# Patient Record
Sex: Female | Born: 1950 | Marital: Married | Smoking: Never smoker
Health system: Southern US, Community
[De-identification: ages and names within clinical notes are randomized; demographics above are authoritative.]

## PROBLEM LIST (undated history)

## (undated) DIAGNOSIS — L438 Other lichen planus: Secondary | ICD-10-CM

## (undated) DIAGNOSIS — M810 Age-related osteoporosis without current pathological fracture: Secondary | ICD-10-CM

## (undated) HISTORY — DX: Other lichen planus: L43.8

## (undated) HISTORY — PX: TUBAL LIGATION: SHX77

---

## 1999-02-24 ENCOUNTER — Ambulatory Visit (HOSPITAL_COMMUNITY): Admission: RE | Admit: 1999-02-24 | Discharge: 1999-02-24 | Payer: Self-pay

## 1999-11-01 ENCOUNTER — Encounter: Admission: RE | Admit: 1999-11-01 | Discharge: 1999-11-01 | Payer: Self-pay | Admitting: *Deleted

## 1999-11-01 ENCOUNTER — Encounter: Payer: Self-pay | Admitting: Allergy and Immunology

## 2005-12-17 ENCOUNTER — Encounter: Admission: RE | Admit: 2005-12-17 | Discharge: 2005-12-17 | Payer: Self-pay | Admitting: Cardiovascular Disease

## 2008-01-06 ENCOUNTER — Ambulatory Visit: Payer: Self-pay | Admitting: Internal Medicine

## 2009-02-15 ENCOUNTER — Ambulatory Visit: Payer: Self-pay | Admitting: Internal Medicine

## 2010-09-20 ENCOUNTER — Ambulatory Visit: Payer: Self-pay | Admitting: Internal Medicine

## 2012-01-28 ENCOUNTER — Encounter: Payer: Self-pay | Admitting: Orthopedic Surgery

## 2012-01-31 ENCOUNTER — Encounter: Payer: Self-pay | Admitting: Orthopedic Surgery

## 2012-03-02 ENCOUNTER — Encounter: Payer: Self-pay | Admitting: Orthopedic Surgery

## 2013-12-08 ENCOUNTER — Ambulatory Visit: Payer: Self-pay | Admitting: Internal Medicine

## 2015-12-26 ENCOUNTER — Other Ambulatory Visit: Payer: Self-pay | Admitting: Internal Medicine

## 2015-12-26 DIAGNOSIS — Z1231 Encounter for screening mammogram for malignant neoplasm of breast: Secondary | ICD-10-CM

## 2015-12-30 ENCOUNTER — Ambulatory Visit
Admission: RE | Admit: 2015-12-30 | Discharge: 2015-12-30 | Disposition: A | Discharge status: Home / Self Care | Payer: BLUE CROSS/BLUE SHIELD | Source: Ambulatory Visit | Attending: Internal Medicine | Admitting: Internal Medicine

## 2015-12-30 ENCOUNTER — Other Ambulatory Visit: Payer: Self-pay | Admitting: Internal Medicine

## 2015-12-30 DIAGNOSIS — Z1231 Encounter for screening mammogram for malignant neoplasm of breast: Secondary | ICD-10-CM

## 2016-06-12 ENCOUNTER — Observation Stay
Admission: EM | Admit: 2016-06-12 | Discharge: 2016-06-13 | Disposition: A | Discharge status: Home / Self Care | Payer: BLUE CROSS/BLUE SHIELD | Attending: Internal Medicine | Admitting: Internal Medicine

## 2016-06-12 ENCOUNTER — Emergency Department: Payer: BLUE CROSS/BLUE SHIELD

## 2016-06-12 ENCOUNTER — Encounter: Payer: Self-pay | Admitting: Emergency Medicine

## 2016-06-12 DIAGNOSIS — R4781 Slurred speech: Secondary | ICD-10-CM | POA: Diagnosis not present

## 2016-06-12 DIAGNOSIS — G459 Transient cerebral ischemic attack, unspecified: Principal | ICD-10-CM | POA: Diagnosis present

## 2016-06-12 DIAGNOSIS — R112 Nausea with vomiting, unspecified: Secondary | ICD-10-CM | POA: Diagnosis not present

## 2016-06-12 LAB — URINALYSIS, COMPLETE (UACMP) WITH MICROSCOPIC
BACTERIA UA: NONE SEEN
Bilirubin Urine: NEGATIVE
GLUCOSE, UA: NEGATIVE mg/dL
Hgb urine dipstick: NEGATIVE
Ketones, ur: NEGATIVE mg/dL
Leukocytes, UA: NEGATIVE
NITRITE: NEGATIVE
Protein, ur: NEGATIVE mg/dL
SPECIFIC GRAVITY, URINE: 1.004 — AB (ref 1.005–1.030)
WBC, UA: NONE SEEN WBC/hpf (ref 0–5)
pH: 6 (ref 5.0–8.0)

## 2016-06-12 LAB — CBC WITH DIFFERENTIAL/PLATELET
BASOS ABS: 0 10*3/uL (ref 0–0.1)
BASOS PCT: 1 %
EOS PCT: 2 %
Eosinophils Absolute: 0.2 10*3/uL (ref 0–0.7)
HEMATOCRIT: 39.3 % (ref 35.0–47.0)
Hemoglobin: 13.1 g/dL (ref 12.0–16.0)
Lymphocytes Relative: 27 %
Lymphs Abs: 1.9 10*3/uL (ref 1.0–3.6)
MCH: 30.9 pg (ref 26.0–34.0)
MCHC: 33.5 g/dL (ref 32.0–36.0)
MCV: 92.2 fL (ref 80.0–100.0)
MONO ABS: 0.5 10*3/uL (ref 0.2–0.9)
Monocytes Relative: 7 %
NEUTROS ABS: 4.4 10*3/uL (ref 1.4–6.5)
Neutrophils Relative %: 63 %
PLATELETS: 290 10*3/uL (ref 150–440)
RBC: 4.26 MIL/uL (ref 3.80–5.20)
RDW: 12.3 % (ref 11.5–14.5)
WBC: 7 10*3/uL (ref 3.6–11.0)

## 2016-06-12 LAB — BASIC METABOLIC PANEL
ANION GAP: 6 (ref 5–15)
BUN: 15 mg/dL (ref 6–20)
CALCIUM: 9.4 mg/dL (ref 8.9–10.3)
CO2: 25 mmol/L (ref 22–32)
Chloride: 107 mmol/L (ref 101–111)
Creatinine, Ser: 0.92 mg/dL (ref 0.44–1.00)
GFR calc non Af Amer: >60 mL/min (ref >60)
Glucose, Bld: 111 mg/dL — ABNORMAL HIGH (ref 65–99)
POTASSIUM: 4.4 mmol/L (ref 3.5–5.1)
Sodium: 138 mmol/L (ref 135–145)

## 2016-06-12 LAB — TROPONIN I: Troponin I: <0.03 ng/mL (ref <0.03)

## 2016-06-12 MED ORDER — STROKE: EARLY STAGES OF RECOVERY BOOK
Freq: Once | Status: AC
  Administered 2016-06-12: 22:00:00

## 2016-06-12 MED ORDER — ENOXAPARIN SODIUM 40 MG/0.4ML ~~LOC~~ SOLN
40.0000 mg | SUBCUTANEOUS | Status: DC
  Administered 2016-06-12: 23:00:00 40 mg via SUBCUTANEOUS
  Filled 2016-06-12: qty 0.4

## 2016-06-12 MED ORDER — ASPIRIN EC 325 MG PO TBEC
325.0000 mg | DELAYED_RELEASE_TABLET | Freq: Every day | ORAL | Status: DC
  Administered 2016-06-13: 11:00:00 325 mg via ORAL
  Filled 2016-06-12: qty 1

## 2016-06-12 MED ORDER — ACETAMINOPHEN 325 MG PO TABS: 650.0000 mg | ORAL_TABLET | ORAL | Status: DC | PRN

## 2016-06-12 MED ORDER — SODIUM CHLORIDE 0.9 % IV SOLN
INTRAVENOUS | Status: AC
  Administered 2016-06-12: 22:00:00 via INTRAVENOUS

## 2016-06-12 MED ORDER — ACETAMINOPHEN 650 MG RE SUPP: 650.0000 mg | RECTAL | Status: DC | PRN

## 2016-06-12 MED ORDER — ACETAMINOPHEN 160 MG/5ML PO SOLN: 650.0000 mg | ORAL | Status: DC | PRN

## 2016-06-12 MED ORDER — ASPIRIN 81 MG PO CHEW
324.0000 mg | CHEWABLE_TABLET | Freq: Once | ORAL | Status: AC
  Administered 2016-06-12: 324 mg via ORAL
  Filled 2016-06-12: qty 4

## 2016-06-12 MED ORDER — INFLUENZA VAC SPLIT QUAD 0.5 ML IM SUSY
0.5000 mL | PREFILLED_SYRINGE | INTRAMUSCULAR | Status: AC
  Administered 2016-06-13: 11:00:00 0.5 mL via INTRAMUSCULAR
  Filled 2016-06-12: qty 0.5

## 2016-06-12 MED ORDER — SENNOSIDES-DOCUSATE SODIUM 8.6-50 MG PO TABS: 1.0000 | ORAL_TABLET | Freq: Every evening | ORAL | Status: DC | PRN

## 2016-06-12 NOTE — ED Triage Notes (Signed)
Patient comes in via [facility] for dizziness, slurred speech, vomiting and generalized weakness. Patient was at doctors office with husband went to bathroom had a bowel movement afterward got dizzy, vomitted and had some slurred speech.

## 2016-06-12 NOTE — H&P (Signed)
[facility] [place] [facility]   PATIENT NAME: Stacy Murphy    MR#:  [id number]  DATE OF BIRTH:  11-14-1950  DATE OF ADMISSION:  06/12/2016  PRIMARY CARE PHYSICIAN: Masood,MD REQUESTING/REFERRING PHYSICIAN: Nita Sicklearolina Veronese, MD  CHIEF COMPLAINT:   dizzy and slurry speech  HISTORY OF PRESENT ILLNESS:  Stacy Murphy  is a 65 y.o. female with No significant past medical history when to Dr. Cleatis PolkaMasood office with her husband to accompany him for follow-up appointment. Patient became dizzy and felt like room is spinning around her. Nauseous and vomited once. then she started having slurry speech. EMS was called and patient's EKG and blood glucose was normal. Patient felt better after she vomited once and had a bowel movement. Denies any similar complaints in the past. Denies any double vision or blurry vision. Denies any headache. CT head is negative in the emergency department  PAST MEDICAL HISTORY:  History reviewed. No pertinent past medical history.  PAST SURGICAL HISTOIRY:  History reviewed. No pertinent surgical history.  SOCIAL HISTORY:   Social History  Substance Use Topics  . Smoking status: Never Smoker  . Smokeless tobacco: Never Used  . Alcohol use No    FAMILY HISTORY:   Family History  Problem Relation Age of Onset  . Breast cancer Neg Hx     DRUG ALLERGIES:  No Known Allergies  REVIEW OF SYSTEMS:  CONSTITUTIONAL: No fever, fatigue or weakness.  EYES: No blurred or double vision.  EARS, NOSE, AND THROAT: No tinnitus or ear pain.  RESPIRATORY: No cough, shortness of breath, wheezing or hemoptysis.  CARDIOVASCULAR: No chest pain, orthopnea, edema.  GASTROINTESTINAL: No nausea, vomiting, diarrhea or abdominal pain.  GENITOURINARY: No dysuria, hematuria.  ENDOCRINE: No polyuria, nocturia,  HEMATOLOGY: No anemia, easy bruising or bleeding SKIN: No rash or lesion. MUSCULOSKELETAL: No joint pain or arthritis.   NEUROLOGIC: No  tingling, numbness, weakness.  PSYCHIATRY: No anxiety or depression.   MEDICATIONS AT HOME:   Prior to Admission medications   Medication Sig Start Date End Date Taking? Authorizing Provider  ibuprofen (ADVIL,MOTRIN) 200 MG tablet Take 200 mg by mouth every 6 (six) hours as needed.   Yes Historical Provider, MD      VITAL SIGNS:  Blood pressure 134/73, pulse 94, resp. rate 12, height 5\' 2"  (1.575 m), weight 58.1 kg (128 lb), SpO2 100 %.  PHYSICAL EXAMINATION:  GENERAL:  65 y.o.-year-old patient lying in the bed with no acute distress.  EYES: Pupils equal, round, reactive to light and accommodation. No scleral icterus. Extraocular muscles intact.  HEENT: Head atraumatic, normocephalic. Oropharynx and nasopharynx clear.  NECK:  Supple, no jugular venous distention. No thyroid enlargement, no tenderness.  LUNGS: Normal breath sounds bilaterally, no wheezing, rales,rhonchi or crepitation. No use of accessory muscles of respiration.  CARDIOVASCULAR: S1, S2 normal. No murmurs, rubs, or gallops.  ABDOMEN: Soft, nontender, nondistended. Bowel sounds present. No organomegaly or mass.  EXTREMITIES: No pedal edema, cyanosis, or clubbing.  NEUROLOGIC: Cranial nerves II through XII are intact. Muscle strength 5/5 in all extremities. Sensation intact. Gait not checked.  PSYCHIATRIC: The patient is alert and oriented x 3.  SKIN: No obvious rash, lesion, or ulcer.   LABORATORY PANEL:   CBC  Recent Labs Lab 06/12/16 1705  WBC 7.0  HGB 13.1  HCT 39.3  PLT 290   ------------------------------------------------------------------------------------------------------------------  Chemistries   Recent Labs Lab 06/12/16 1705  NA 138  K 4.4  CL 107  CO2 25  GLUCOSE  111*  BUN 15  CREATININE 0.92  CALCIUM 9.4   ------------------------------------------------------------------------------------------------------------------  Cardiac Enzymes  Recent Labs Lab 06/12/16 1705  TROPONINI  <0.03   ------------------------------------------------------------------------------------------------------------------  RADIOLOGY:  Ct Head Wo Contrast  Result Date: 06/12/2016 CLINICAL DATA:  Dizziness and slurred speech. Vomiting and weakness. EXAM: CT HEAD WITHOUT CONTRAST TECHNIQUE: Contiguous axial images were obtained from the base of the skull through the vertex without intravenous contrast. COMPARISON:  None. FINDINGS: Brain: The brainstem, cerebellum, cerebral peduncles, thalami, basal ganglia, basilar cisterns, and ventricular system appear within normal limits. No intracranial hemorrhage, mass lesion, or acute CVA. Vascular: Unremarkable Skull: Unremarkable Sinuses/Orbits: Unremarkable Other: No supplemental non-categorized findings. IMPRESSION: 1.  No significant abnormality identified. Electronically Signed   By: Gaylyn RongWalter  Liebkemann M.D.   On: 06/12/2016 17:36    EKG:  No orders found for this or any previous visit.  IMPRESSION AND PLAN:   Stacy Murphy  is a 65 y.o. female with No significant past medical history when to Dr. Cleatis PolkaMasood office with her husband to accompany him for follow-up appointment. Patient became dizzy and felt like room is spinning around her. Nauseous and vomited once. then she started having slurry speech. EMS was called and patient's EKG and blood glucose was normal. Patient felt better after she vomited once  # TIA Med-surg CT head is neg Carotid dopplers and echo doppler ordered   neuro checks  Bedside swallow evaluation  CBC, BMP, TSH, hemoglobin A1c and lipid panel in a.m. PT, OT and speech therapy evaluation  currently patient is nothing by mouth pending bedside swallow evaluation  #Vertigo-could be benign paroxysmal positional vertigo Meclizine as needed PT evaluation Will consider outpatient ENT follow-up if no improvement  #Nausea and vomiting-could be from vertigo No other episodes. Gentle hydration with IV fluids  DVT prophylaxis  with Lovenox subcutaneous  All the records are reviewed and case discussed with ED provider. Management plans discussed with the patient, family and they are in agreement.  CODE STATUS: fc, husband is the HCPOA  TOTAL TIME TAKING CARE OF THIS PATIENT: 41 minutes.   Note: This dictation was prepared with Dragon dictation along with smaller phrase technology. Any transcriptional errors that result from this process are unintentional.  Ramonita LabGouru, Asaiah Scarber M.D on 06/12/2016 at 7:26 PM  Between 7am to 6pm - Pager - [phone]  After 6pm go to [email or web address] - password [email or web address] [facility]  [facility]  Office  [phone]  CC: Primary care physician; No primary care provider on file.

## 2016-06-12 NOTE — ED Notes (Signed)
Patient states that dizziness has resolved

## 2016-06-12 NOTE — ED Provider Notes (Signed)
[facility] Emergency Department Provider Note  ____________________________________________  Time seen: Approximately 5:32 PM  I have reviewed the triage vital signs and the nursing notes.   HISTORY  Chief Complaint Weakness   HPI Stacy Murphy is a 65 y.o. female with no significant past medical history who presents from cardiologist's office for evaluation of dizziness and slurred speech. Patient was at Dr. Renie OraMassoud's office with her husband for a follow-up appointment when she developed sudden onset of vertigo. The nurse came to check on her and she was able to walk with assistance. She reports that the vertigo lasted a few minutes and resolved without intervention. She was noticed to have slurred speech by Dr. Harl BowieMassoud and his nurse which prompted EMS to be called. She had an EKG that was normal. Her blood glucose was also normal. Patient reports she's never had anything like this before. She said that after the episode resolved she went to the bathroom had 1 bowel movement and vomited once and after that felt better. Patient reports that she feels back to her baseline at this time. She denies diplopia, dysphasia, difficulty finding words, unilateral weakness or numbness. She reports that she had a HA right after the vertigo episode that was R sided and moderate in intensity however that has resolved.   History reviewed. No pertinent past medical history.  There are no active problems to display for this patient.   History reviewed. No pertinent surgical history.  Prior to Admission medications   Medication Sig Start Date End Date Taking? Authorizing Provider  ibuprofen (ADVIL,MOTRIN) 200 MG tablet Take 200 mg by mouth every 6 (six) hours as needed.   Yes Historical Provider, MD    Allergies Patient has no known allergies.  Family History  Problem Relation Age of Onset  . Breast cancer Neg Hx     Social History Social History  Substance Use  Topics  . Smoking status: Never Smoker  . Smokeless tobacco: Never Used  . Alcohol use No    Review of Systems  Constitutional: Negative for fever. Eyes: Negative for visual changes. ENT: Negative for sore throat. Neck: No neck pain  Cardiovascular: Negative for chest pain. Respiratory: Negative for shortness of breath. Gastrointestinal: Negative for abdominal pain, vomiting or diarrhea. Genitourinary: Negative for dysuria. Musculoskeletal: Negative for back pain. Skin: Negative for rash. Neurological: Negative for weakness or numbness. + HA, vertigo, slurred speech Psych: No SI or HI  ____________________________________________   PHYSICAL EXAM:  VITAL SIGNS: ED Triage Vitals  Enc Vitals Group     BP 06/12/16 1704 134/73     Pulse Rate 06/12/16 1704 94     Resp 06/12/16 1704 12     Temp --      Temp src --      SpO2 06/12/16 1704 100 %     Weight 06/12/16 1701 128 lb (58.1 kg)     Height 06/12/16 1701 5\' 2"  (1.575 m)     Head Circumference --      Peak Flow --      Pain Score --      Pain Loc --      Pain Edu? --      Excl. in GC? --     Constitutional: Alert and oriented. Well appearing and in no apparent distress. HEENT:      Head: Normocephalic and atraumatic.         Eyes: Conjunctivae are normal. Sclera is non-icteric. EOMI. PERRL  Mouth/Throat: Mucous membranes are moist.       Neck: Supple with no signs of meningismus. Cardiovascular: Regular rate and rhythm. No murmurs, gallops, or rubs. 2+ symmetrical distal pulses are present in all extremities. No JVD. Respiratory: Normal respiratory effort. Lungs are clear to auscultation bilaterally. No wheezes, crackles, or rhonchi.  Gastrointestinal: Soft, non tender, and non distended with positive bowel sounds. No rebound or guarding. Musculoskeletal: Nontender with normal range of motion in all extremities. No edema, cyanosis, or erythema of extremities. Neurologic: Normal speech and language. A & O x3,  PERRL, no nystagmus, CN II-XII intact, motor testing reveals good tone and bulk throughout. There is no evidence of pronator drift or dysmetria. Muscle strength is 5/5 throughout. Deep tendon reflexes are 2+ throughout with downgoing toes. Sensory examination is intact. Gait deferred Skin: Skin is warm, dry and intact. No rash noted. Psychiatric: Mood and affect are normal. Speech and behavior are normal.  ____________________________________________   LABS (all labs ordered are listed, but only abnormal results are displayed)  Labs Reviewed  BASIC METABOLIC PANEL - Abnormal; Notable for the following:       Result Value   Glucose, Bld 111 (*)    All other components within normal limits  CBC WITH DIFFERENTIAL/PLATELET  TROPONIN I  URINALYSIS, COMPLETE (UACMP) WITH MICROSCOPIC   ____________________________________________  EKG  ED ECG REPORT I, Nita Sicklearolina Elke Holtry, the attending physician, personally viewed and interpreted this ECG.  Normal sinus rhythm, rate of 85, normal intervals, normal axis, no ST elevations or depressions. T-wave inversion in lead 3. No prior for comparison. ____________________________________________  RADIOLOGY  Head CT: Negative ____________________________________________   PROCEDURES  Procedure(s) performed: None Procedures Critical Care performed:  None ____________________________________________   INITIAL IMPRESSION / ASSESSMENT AND PLAN / ED COURSE  65 y.o. female with no significant past medical history who presents from cardiologist's office for evaluation of one episode of vertigo associated with and slurred speech at her husband's cardiologist's office. Patient is currently neurologically intact, NIH stroke scale of 0, vital signs are within normal limits, physical exam with no acute findings. EKG is nonischemic. We'll order a CT of her head and check basic blood work. The fact the patient had slurred speech with vertigo episode makes me  concerned for possible TIA. If head CT and blood work showed no acute findings patient will be admitted for TIA workup.   Clinical Course as of Jun 13 1827  Tue Jun 12, 2016  1827 CT head and labs are no acute findings. Patient remains neurologically intact. Full dose aspirin given. Discussed with the hospitalist for admission for possible TIA.  [CV]    Clinical Course User Index [CV] Nita Sicklearolina Anaily Ashbaugh, MD    Pertinent labs & imaging results that were available during my care of the patient were reviewed by me and considered in my medical decision making (see chart for details).    ____________________________________________   FINAL CLINICAL IMPRESSION(S) / ED DIAGNOSES  Final diagnoses:  Transient cerebral ischemia, unspecified type      NEW MEDICATIONS STARTED DURING THIS VISIT:  New Prescriptions   No medications on file     Note:  This document was prepared using Dragon voice recognition software and may include unintentional dictation errors.    Nita Sicklearolina Quanah Majka, MD 06/12/16 [phone]

## 2016-06-13 ENCOUNTER — Observation Stay: Payer: BLUE CROSS/BLUE SHIELD

## 2016-06-13 ENCOUNTER — Observation Stay: Admit: 2016-06-13 | Payer: BLUE CROSS/BLUE SHIELD

## 2016-06-13 LAB — LIPID PANEL
Cholesterol: 140 mg/dL (ref 0–200)
HDL: 56 mg/dL (ref >40)
LDL CALC: 66 mg/dL (ref 0–99)
TRIGLYCERIDES: 89 mg/dL (ref <150)
Total CHOL/HDL Ratio: 2.5 RATIO
VLDL: 18 mg/dL (ref 0–40)

## 2016-06-13 LAB — TSH: TSH: 4.071 u[IU]/mL (ref 0.350–4.500)

## 2016-06-13 NOTE — Progress Notes (Signed)
OT Cancellation Note  Patient Details Name: Stacy Murphy MRN: [id number] DOB: 04-Jan-1951   Cancelled Treatment:    Reason Eval/Treat Not Completed: Patient at procedure or test/ unavailable  Olegario MessierElaine Bacilio Abascal, MS, OTR/L 06/13/2016, 10:00 AM

## 2016-06-13 NOTE — Plan of Care (Signed)
Problem: Tissue Perfusion: Goal: Complications of Ischemic Stroke will be minimized (choose ONE based on patient diagnosis) Outcome: Progressing CT head negative. Neuro consult and additional imaging studies pending. Pt NIH 0. Stroke materials provided to patient and family. Pt eager to go home.

## 2016-06-13 NOTE — Progress Notes (Signed)
[facility]. MD reported back to baseline.

## 2016-06-13 NOTE — Progress Notes (Signed)
Received MD order to discharge patient to home, reviewed discharge instructions home meds with patient and patient verbalized understanding discharged to home in wheelchair by volunteer with family

## 2016-06-13 NOTE — Discharge Instructions (Signed)
Transient Ischemic Attack A transient ischemic attack (TIA) is a "warning stroke" that causes stroke-like symptoms. A TIA does not cause lasting damage to the brain. The symptoms of a TIA can happen fast and do not last long. It is important to know the symptoms of a TIA and what to do. This can help prevent stroke or death. Follow these instructions at home:  Take medicines only as told by your doctor. Make sure you understand all of the instructions.  You may need to take aspirin or warfarin medicine. Warfarin needs to be taken exactly as told.  Taking too much or too little warfarin is dangerous. Blood tests must be done as often as told by your doctor. A PT blood test measures how long it takes for blood to clot. Your PT is used to calculate another value called an INR. Your PT and INR help your doctor adjust your warfarin dosage. He or she will make sure you are taking the right amount.  Food can cause problems with warfarin and affect the results of your blood tests. This is true for foods high in vitamin K. Eat the same amount of foods high in vitamin K each day. Foods high in vitamin K include spinach, kale, broccoli, cabbage, collard and turnip greens, Brussels sprouts, peas, cauliflower, seaweed, and parsley. Other foods high in vitamin K include beef and pork liver, green tea, and soybean oil. Eat the same amount of foods high in vitamin K each day. Avoid big changes in your diet. Tell your doctor before changing your diet. Talk to a food specialist (dietitian) if you have questions.  Many medicines can cause problems with warfarin and affect your PT and INR. Tell your doctor about all medicines you take. This includes vitamins and dietary pills (supplements). Do not take or stop taking any prescribed or over-the-counter medicines unless your doctor tells you to.  Warfarin can cause more bruising or bleeding. Hold pressure over any cuts for longer than normal. Talk to your doctor about  other side effects of warfarin.  Avoid sports or activities that may cause injury or bleeding.  Be careful when you shave, floss, or use sharp objects.  Avoid or drink very little alcohol while taking warfarin. Tell your doctor if you change how much alcohol you drink.  Tell your dentist and other doctors that you take warfarin before any procedures.  Follow your diet program as told, if you are given one.  Keep a healthy weight.  Stay active. Try to get at least 30 minutes of activity on all or most days.  Do not use any tobacco products, including cigarettes, chewing tobacco, or electronic cigarettes. If you need help quitting, ask your doctor.  Limit alcohol intake to no more than 1 drink per day for nonpregnant women and 2 drinks per day for men. One drink equals 12 ounces of beer, 5 ounces of wine, or 1 ounces of hard liquor.  Do not abuse drugs.  Keep your home safe so you do not fall. You can do this by:  Putting grab bars in the bedroom and bathroom.  Raising toilet seats.  Putting a seat in the shower.  Keep all follow-up visits as told by your doctor. This is important. Contact a doctor if:  Your personality changes.  You have trouble swallowing.  You have double vision.  You are dizzy.  You have a fever. Get help right away if: These symptoms may be an emergency. Do not wait to see  if the symptoms will go away. Get medical help right away. Call your local emergency services (911 in the [place] Do not drive yourself to the hospital.  You have sudden weakness or lose feeling (go numb), especially on one side of the body. This can affect your:  Face.  Arm.  Leg.  You have sudden trouble walking.  You have sudden trouble moving your arms or legs.  You have sudden confusion.  You have trouble talking.  You have trouble understanding.  You have sudden trouble seeing in one or both eyes.  You lose your balance.  Your movements are not  smooth.  You have a sudden, very bad headache with no known cause.  You have new chest pain.  Your heartbeat is unsteady.  You are partly or totally unaware of what is going on around you. This information is not intended to replace advice given to you by your health care provider. Make sure you discuss any questions you have with your health care provider. Document Released: 03/27/2008 Document Revised: 02/20/2016 Document Reviewed: 09/23/2013 Elsevier Interactive Patient Education  2017 [employer] Near-Syncope Introduction Near-syncope is when you suddenly get weak or dizzy, or you feel like you might pass out (faint). During an episode of near-syncope, you may:  Feel dizzy or light-headed.  Feel sick to your stomach (nauseous).  See all white or all black.  Have cold, clammy skin. If you passed out, get help right away.Call your local emergency services (911 in the [place] Do not drive yourself to the hospital. Follow these instructions at home: Pay attention to any changes in your symptoms. Take these actions to help with your condition:  Have someone stay with you until you feel stable.  Do not drive, use machinery, or play sports until your doctor says it is okay.  Keep all follow-up visits as told by your doctor. This is important.  If you start to feel like you might pass out, lie down right away and raise (elevate) your feet above the level of your heart. Breathe deeply and steadily. Wait until all of the symptoms are gone.  Drink enough fluid to keep your pee (urine) clear or pale yellow.  If you are taking blood pressure or heart medicine, get up slowly and spend many minutes getting ready to sit and then stand. This can help with dizziness.  Take over-the-counter and prescription medicines only as told by your doctor. Get help right away if:  You have a very bad headache.  You have unusual pain in your chest, tummy, or back.  You are bleeding from your  mouth or rectum.  You have black or tarry poop (stool).  You have a very fast or uneven heartbeat (palpitations).  You pass out one time or more than once.  You have jerky movements that you cannot control (seizure).  You are confused.  You have trouble walking.  You are very weak.  You have vision problems. These symptoms may be an emergency. Do not wait to see if the symptoms will go away. Get medical help right away. Call your local emergency services (911 in the [place] Do not drive yourself to the hospital.  This information is not intended to replace advice given to you by your health care provider. Make sure you discuss any questions you have with your health care provider. Document Released: 12/05/2007 Document Revised: 11/24/2015 Document Reviewed: 03/02/2015  2017 Elsevier

## 2016-06-13 NOTE — Progress Notes (Signed)
PT Cancellation Note  Patient Details Name: Stacy Murphy MRN: [id number] DOB: December 20, 1950   Cancelled Treatment:    Reason Eval/Treat Not Completed: Patient at procedure or test/unavailable.  Will try later as time and pt allow.   Ivar DrapeStout, Martice Doty E 06/13/2016, 9:52 AM    Samul Dadauth Raihan Kimmel, PT MS Acute Rehab Dept. Number: [facility] [phone] and [facility] [phone]

## 2016-06-14 LAB — HEMOGLOBIN A1C
HEMOGLOBIN A1C: 5.9 % — AB (ref 4.8–5.6)
Mean Plasma Glucose: 123 mg/dL

## 2016-06-16 NOTE — Discharge Summary (Signed)
[facility] [place] [facility]   PATIENT NAME: Stacy Murphy    MR#:  [id number]  DATE OF BIRTH:  1951-03-22  DATE OF ADMISSION:  06/12/2016   ADMITTING PHYSICIAN: Ramonita LabAruna Gouru, MD  DATE OF DISCHARGE: 06/13/2016 11:50 AM  PRIMARY CARE PHYSICIAN: MASOUD,JAVED, MD   ADMISSION DIAGNOSIS:  Transient cerebral ischemia, unspecified type [G45.9] DISCHARGE DIAGNOSIS:  Active Problems:   TIA (transient ischemic attack)  SECONDARY DIAGNOSIS:  History reviewed. No pertinent past medical history. HOSPITAL COURSE:  Stacy Murphy  is a 65 y.o. female with No significant past medical history when to Dr. Cleatis PolkaMasood office with her husband to accompany him for follow-up appointment. Patient became dizzy and felt like room is spinning around her. Nauseous and vomited once. then she started having slurry speech. EMS was called and patient's EKG and blood glucose was normal. Patient felt better after she vomited once  # TIA Ruled out MRI neg Likely vasovagal syncope  DISCHARGE CONDITIONS:  stable CONSULTS OBTAINED:   DRUG ALLERGIES:  No Known Allergies DISCHARGE MEDICATIONS:   Allergies as of 06/13/2016   No Known Allergies     Medication List    TAKE these medications   ibuprofen 200 MG tablet Commonly known as:  ADVIL,MOTRIN Take 200 mg by mouth every 6 (six) hours as needed.      DISCHARGE INSTRUCTIONS:   DIET:  Regular diet DISCHARGE CONDITION:  Good ACTIVITY:  Activity as tolerated OXYGEN:  Home Oxygen: No.  Oxygen Delivery: room air DISCHARGE LOCATION:  home   If you experience worsening of your admission symptoms, develop shortness of breath, life threatening emergency, suicidal or homicidal thoughts you must seek medical attention immediately by calling 911 or calling your MD immediately  if symptoms less severe.  You Must read complete instructions/literature along with all the possible adverse reactions/side effects for all the  Medicines you take and that have been prescribed to you. Take any new Medicines after you have completely understood and accpet all the possible adverse reactions/side effects.   Please note  You were cared for by a hospitalist during your hospital stay. If you have any questions about your discharge medications or the care you received while you were in the hospital after you are discharged, you can call the unit and asked to speak with the hospitalist on call if the hospitalist that took care of you is not available. Once you are discharged, your primary care physician will handle any further medical issues. Please note that NO REFILLS for any discharge medications will be authorized once you are discharged, as it is imperative that you return to your primary care physician (or establish a relationship with a primary care physician if you do not have one) for your aftercare needs so that they can reassess your need for medications and monitor your lab values.    On the day of Discharge:  VITAL SIGNS:  Blood pressure (!) 92/48, pulse 73, temperature 98 F (36.7 C), temperature source Oral, resp. rate 18, height 5\' 2"  (1.575 m), weight 59.4 kg (131 lb), SpO2 96 %. PHYSICAL EXAMINATION:  GENERAL:  65 y.o.-year-old patient lying in the bed with no acute distress.  EYES: Pupils equal, round, reactive to light and accommodation. No scleral icterus. Extraocular muscles intact.  HEENT: Head atraumatic, normocephalic. Oropharynx and nasopharynx clear.  NECK:  Supple, no jugular venous distention. No thyroid enlargement, no tenderness.  LUNGS: Normal breath sounds bilaterally, no wheezing, rales,rhonchi or crepitation. No use of accessory  muscles of respiration.  CARDIOVASCULAR: S1, S2 normal. No murmurs, rubs, or gallops.  ABDOMEN: Soft, non-tender, non-distended. Bowel sounds present. No organomegaly or mass.  EXTREMITIES: No pedal edema, cyanosis, or clubbing.  NEUROLOGIC: Cranial nerves II through  XII are intact. Muscle strength 5/5 in all extremities. Sensation intact. Gait not checked.  PSYCHIATRIC: The patient is alert and oriented x 3.  SKIN: No obvious rash, lesion, or ulcer.  DATA REVIEW:   CBC  Recent Labs Lab 06/12/16 1705  WBC 7.0  HGB 13.1  HCT 39.3  PLT 290    Chemistries   Recent Labs Lab 06/12/16 1705  NA 138  K 4.4  CL 107  CO2 25  GLUCOSE 111*  BUN 15  CREATININE 0.92  CALCIUM 9.4   Follow-up Information    MASOUD,JAVED, MD. Go on 06/19/2016.   Specialty:  Internal Medicine Why:  @11 :15 AM  Contact information: [address] [place] [address] [phone]            Management plans discussed with the patient, family and they are in agreement.  CODE STATUS: FULL CODE  TOTAL TIME TAKING CARE OF THIS PATIENT: 45 minutes.    Delfino LovettVipul Daryana Whirley M.D on 06/16/2016 at 1:34 PM  Between 7am to 6pm - Pager - [phone]  After 6pm go to [email or web address] - Social research officer, governmentpassword [email or web address]  [facility]  Office  [phone]  CC: Primary care physician; Corky DownsMASOUD,JAVED, MD   Note: This dictation was prepared with Dragon dictation along with smaller phrase technology. Any transcriptional errors that result from this process are unintentional.

## 2016-11-20 ENCOUNTER — Other Ambulatory Visit: Payer: Self-pay | Admitting: Internal Medicine

## 2016-11-20 DIAGNOSIS — Z1231 Encounter for screening mammogram for malignant neoplasm of breast: Secondary | ICD-10-CM

## 2016-12-31 ENCOUNTER — Ambulatory Visit
Admission: RE | Admit: 2016-12-31 | Discharge: 2016-12-31 | Disposition: A | Discharge status: Home / Self Care | Payer: Medicare Other | Source: Ambulatory Visit | Attending: Internal Medicine | Admitting: Internal Medicine

## 2016-12-31 DIAGNOSIS — Z1231 Encounter for screening mammogram for malignant neoplasm of breast: Secondary | ICD-10-CM | POA: Diagnosis not present

## 2017-10-22 ENCOUNTER — Other Ambulatory Visit: Payer: Self-pay | Admitting: Internal Medicine

## 2017-10-22 DIAGNOSIS — Z1239 Encounter for other screening for malignant neoplasm of breast: Secondary | ICD-10-CM

## 2017-12-13 IMAGING — MG MM DIGITAL SCREENING BILAT W/ TOMO W/ CAD
9 of 13 series · 9 of 29 positions shown · non-contrast
Comparison: Previous exam(s).

CLINICAL DATA: Screening.

EXAM:
2D DIGITAL SCREENING BILATERAL MAMMOGRAM WITH CAD AND ADJUNCT TOMO

[R MLO (1 of 2)]
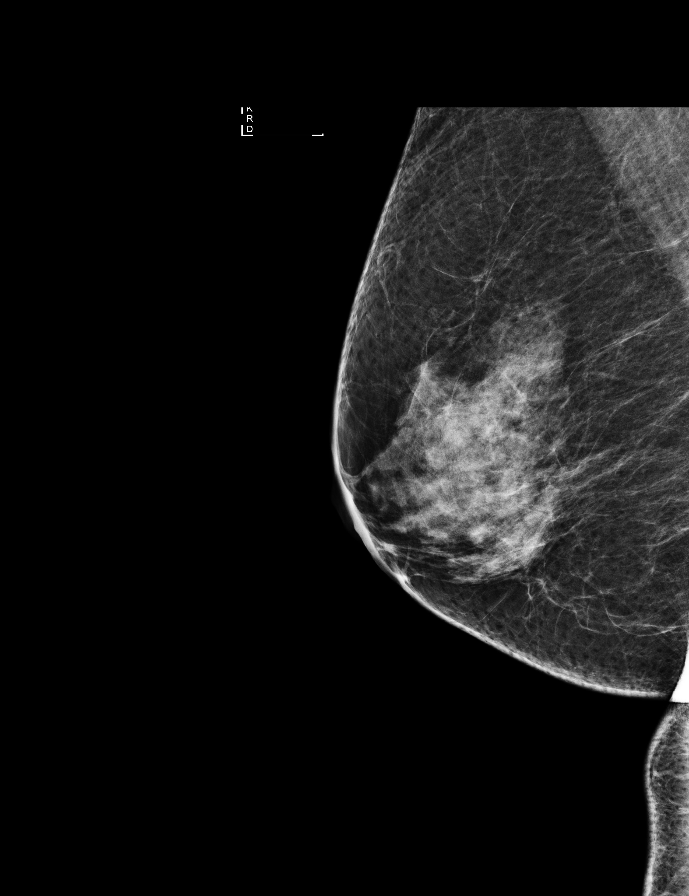

[L MLO synth-2D]
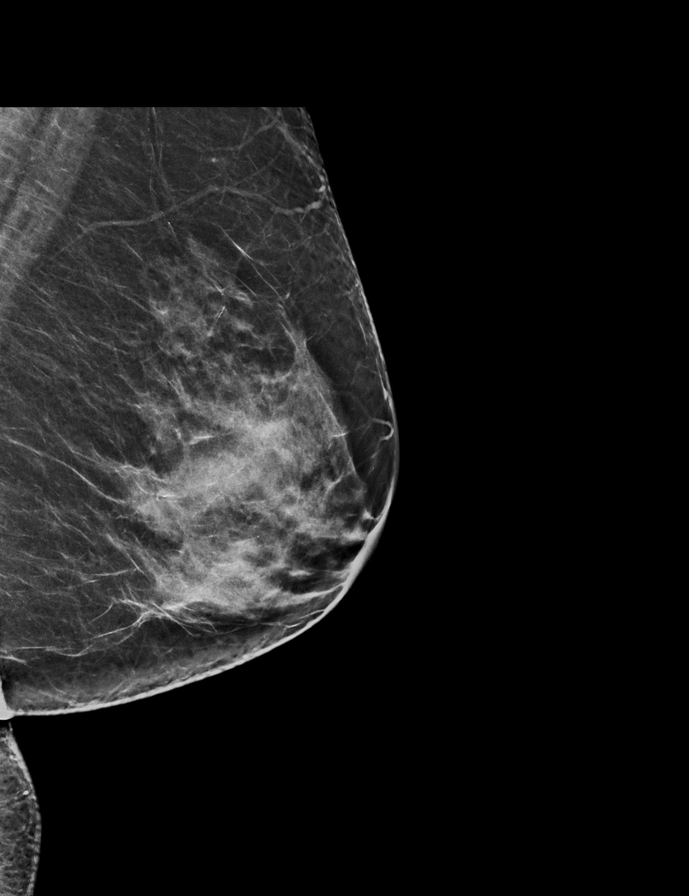

[R MLO (2 of 2)]
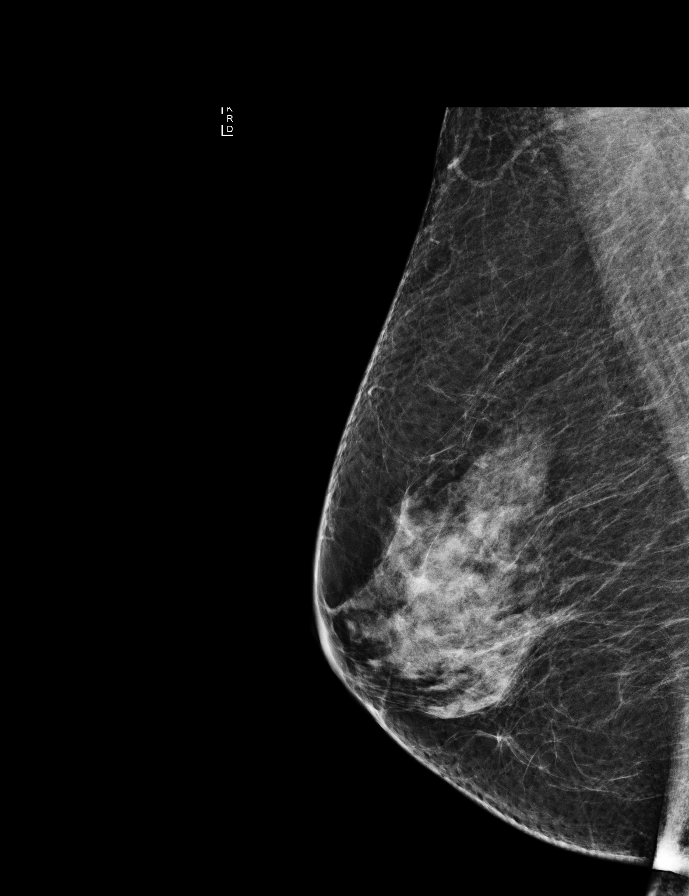

[R MLO synth-2D]
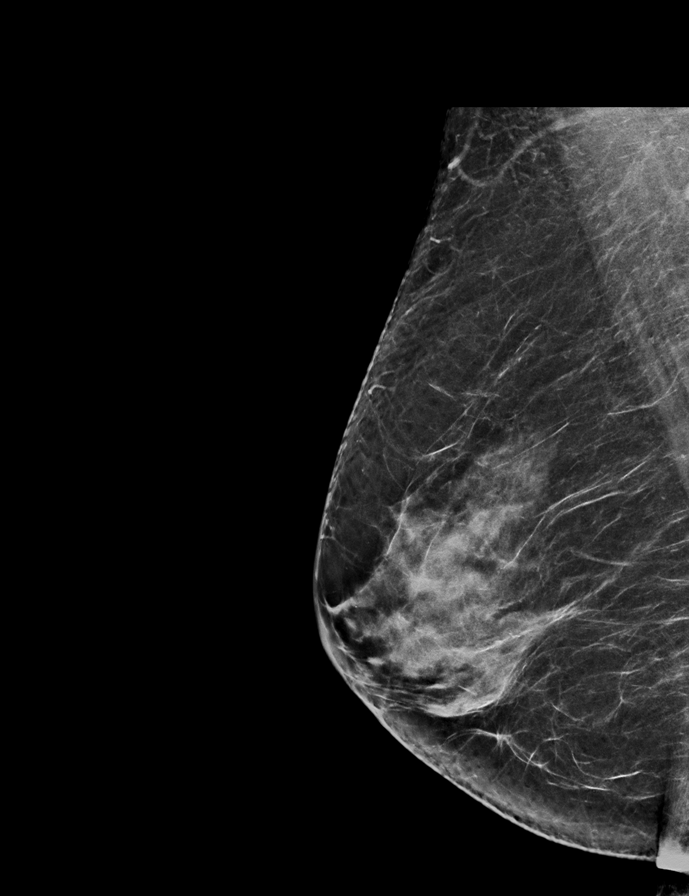

[L CC]
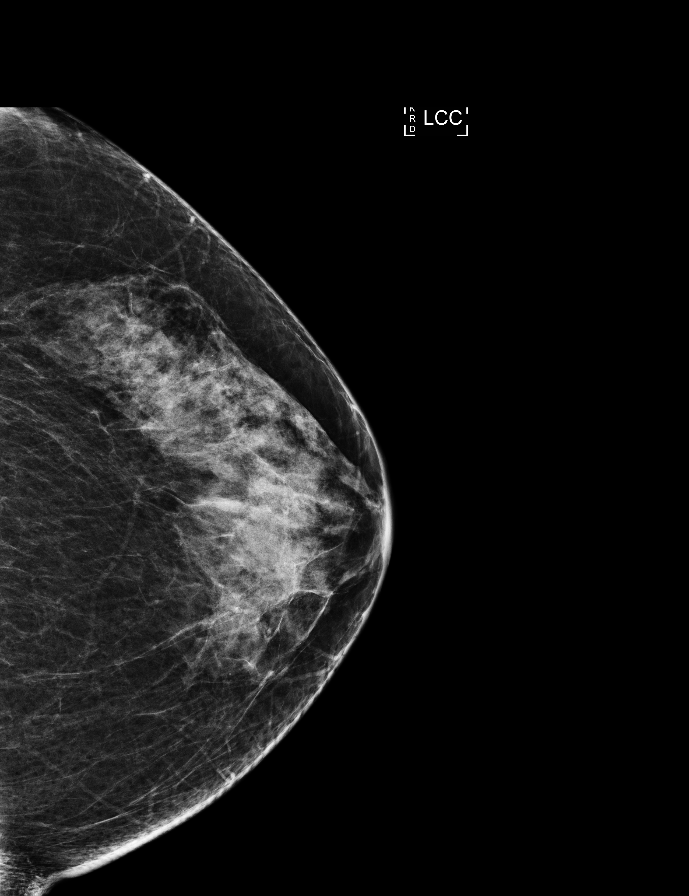

[L CC synth-2D]
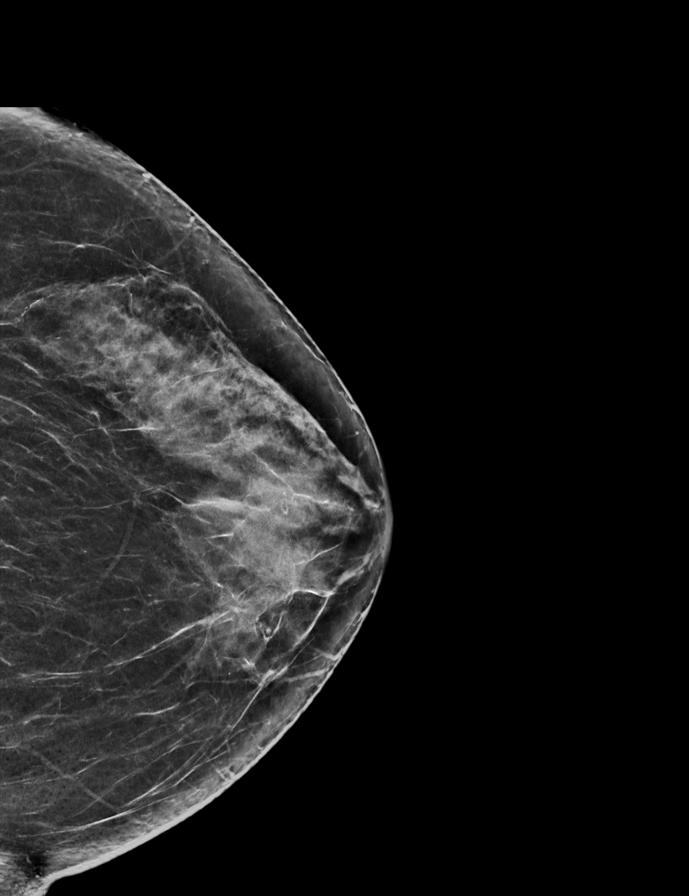

[L MLO]
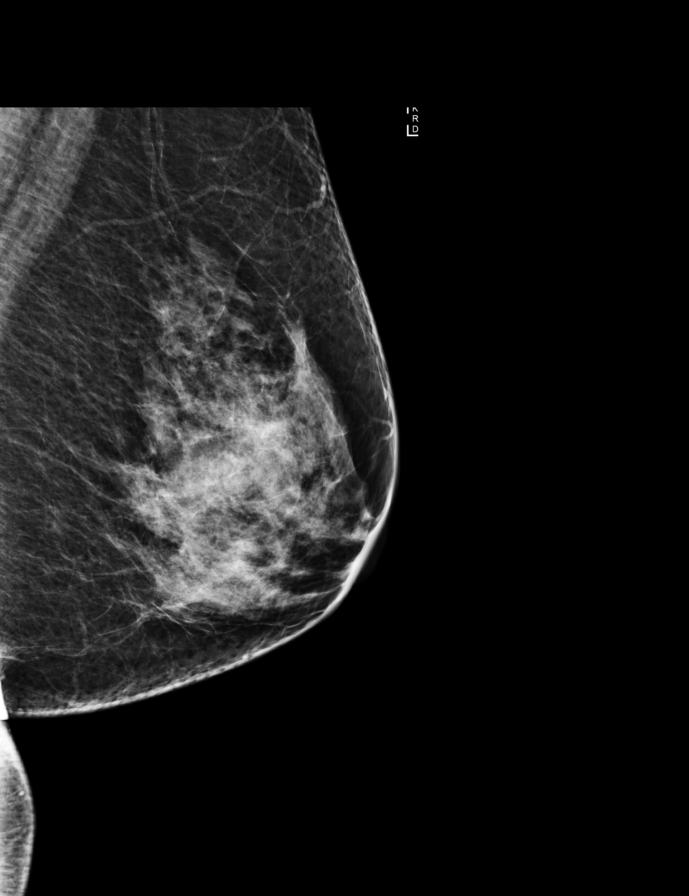

[R CC synth-2D]
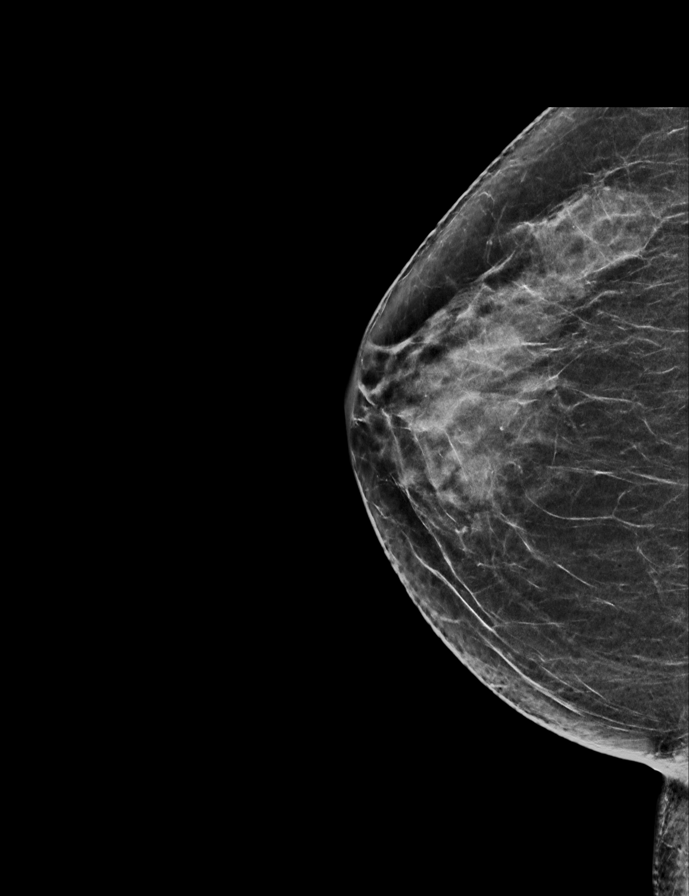

[R CC]
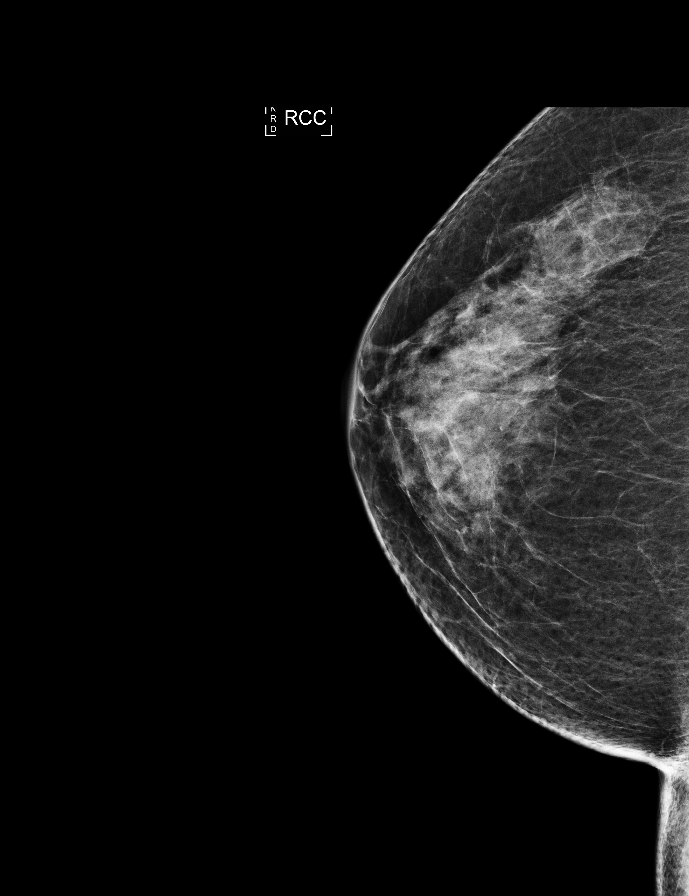

[9 of 29 positions shown; findings below may reference images not displayed]

ACR Breast Density Category c: The breast tissue is heterogeneously
dense, which may obscure small masses.
FINDINGS: There are no findings suspicious for malignancy. Images were
processed with CAD.
IMPRESSION: No mammographic evidence of malignancy. A result letter of this
screening mammogram will be mailed directly to the patient.

RECOMMENDATION:
Screening mammogram in one year. (Code:[id number])

BI-RADS CATEGORY  1: Negative.

## 2018-01-22 ENCOUNTER — Ambulatory Visit
Admission: RE | Admit: 2018-01-22 | Discharge: 2018-01-22 | Disposition: A | Discharge status: Home / Self Care | Payer: Medicare Other | Source: Ambulatory Visit | Attending: Internal Medicine | Admitting: Internal Medicine

## 2018-01-22 DIAGNOSIS — Z1231 Encounter for screening mammogram for malignant neoplasm of breast: Secondary | ICD-10-CM | POA: Insufficient documentation

## 2018-01-22 DIAGNOSIS — Z1239 Encounter for other screening for malignant neoplasm of breast: Secondary | ICD-10-CM

## 2018-10-14 DIAGNOSIS — J069 Acute upper respiratory infection, unspecified: Secondary | ICD-10-CM | POA: Diagnosis not present

## 2018-10-16 DIAGNOSIS — M25561 Pain in right knee: Secondary | ICD-10-CM | POA: Diagnosis not present

## 2018-10-16 DIAGNOSIS — Z0000 Encounter for general adult medical examination without abnormal findings: Secondary | ICD-10-CM | POA: Diagnosis not present

## 2018-10-16 DIAGNOSIS — M25551 Pain in right hip: Secondary | ICD-10-CM | POA: Diagnosis not present

## 2018-10-16 DIAGNOSIS — M545 Low back pain: Secondary | ICD-10-CM | POA: Diagnosis not present

## 2018-12-24 ENCOUNTER — Other Ambulatory Visit: Payer: Self-pay | Admitting: Internal Medicine

## 2018-12-24 DIAGNOSIS — Z1231 Encounter for screening mammogram for malignant neoplasm of breast: Secondary | ICD-10-CM

## 2019-01-12 DIAGNOSIS — K006 Disturbances in tooth eruption: Secondary | ICD-10-CM | POA: Diagnosis not present

## 2019-01-29 ENCOUNTER — Ambulatory Visit
Admission: RE | Admit: 2019-01-29 | Discharge: 2019-01-29 | Disposition: A | Discharge status: Home / Self Care | Payer: Medicare HMO | Source: Ambulatory Visit | Attending: Internal Medicine | Admitting: Internal Medicine

## 2019-01-29 DIAGNOSIS — Z1231 Encounter for screening mammogram for malignant neoplasm of breast: Secondary | ICD-10-CM | POA: Diagnosis not present

## 2019-02-04 DIAGNOSIS — L439 Lichen planus, unspecified: Secondary | ICD-10-CM | POA: Diagnosis not present

## 2019-02-04 DIAGNOSIS — L299 Pruritus, unspecified: Secondary | ICD-10-CM | POA: Diagnosis not present

## 2019-02-04 DIAGNOSIS — D692 Other nonthrombocytopenic purpura: Secondary | ICD-10-CM | POA: Diagnosis not present

## 2019-02-13 DIAGNOSIS — K121 Other forms of stomatitis: Secondary | ICD-10-CM | POA: Diagnosis not present

## 2019-02-26 DIAGNOSIS — K1379 Other lesions of oral mucosa: Secondary | ICD-10-CM | POA: Diagnosis not present

## 2019-02-26 DIAGNOSIS — L720 Epidermal cyst: Secondary | ICD-10-CM | POA: Diagnosis not present

## 2019-03-18 DIAGNOSIS — L438 Other lichen planus: Secondary | ICD-10-CM | POA: Diagnosis not present

## 2019-03-30 DIAGNOSIS — R21 Rash and other nonspecific skin eruption: Secondary | ICD-10-CM | POA: Diagnosis not present

## 2019-04-13 DIAGNOSIS — R399 Unspecified symptoms and signs involving the genitourinary system: Secondary | ICD-10-CM | POA: Diagnosis not present

## 2019-04-16 DIAGNOSIS — L439 Lichen planus, unspecified: Secondary | ICD-10-CM | POA: Diagnosis not present

## 2019-05-07 DIAGNOSIS — H2513 Age-related nuclear cataract, bilateral: Secondary | ICD-10-CM | POA: Diagnosis not present

## 2019-05-21 DIAGNOSIS — L439 Lichen planus, unspecified: Secondary | ICD-10-CM | POA: Diagnosis not present

## 2019-05-29 DIAGNOSIS — R05 Cough: Secondary | ICD-10-CM | POA: Diagnosis not present

## 2019-05-29 DIAGNOSIS — Z20828 Contact with and (suspected) exposure to other viral communicable diseases: Secondary | ICD-10-CM | POA: Diagnosis not present

## 2019-07-23 ENCOUNTER — Ambulatory Visit: Payer: Medicare HMO | Attending: Internal Medicine

## 2019-07-23 DIAGNOSIS — Z23 Encounter for immunization: Secondary | ICD-10-CM

## 2019-07-23 NOTE — Progress Notes (Signed)
   Covid-19 Vaccination Clinic  Name:  Stacy Murphy    MRN: [id number] DOB: Aug 26, 1950  07/23/2019  [name] was observed post Covid-19 immunization for 15 minutes without incidence. She was provided with Vaccine Information Sheet and instruction to access the V-Safe system.   [name] was instructed to call 911 with any severe reactions post vaccine: Marland Kitchen Difficulty breathing  . Swelling of your face and throat  . A fast heartbeat  . A bad rash all over your body  . Dizziness and weakness    Immunizations Administered    Name Date Dose VIS Date Route   Pfizer COVID-19 Vaccine 07/23/2019  5:14 PM 0.3 mL 06/12/2019 Intramuscular   Manufacturer: [employer], [employer]   Lot: [id number]   NDC: [address]-2

## 2019-08-10 ENCOUNTER — Ambulatory Visit: Payer: Medicare HMO | Attending: Internal Medicine

## 2019-08-10 DIAGNOSIS — Z23 Encounter for immunization: Secondary | ICD-10-CM

## 2019-08-10 NOTE — Progress Notes (Signed)
   Covid-19 Vaccination Clinic  Name:  TRANIKA SCHOLLER    MRN: [id number] DOB: October 09, 1950  08/10/2019  Ms. Gertz was observed post Covid-19 immunization for 15 minutes without incidence. She was provided with Vaccine Information Sheet and instruction to access the V-Safe system.   Ms. Neary was instructed to call 911 with any severe reactions post vaccine: Marland Kitchen Difficulty breathing  . Swelling of your face and throat  . A fast heartbeat  . A bad rash all over your body  . Dizziness and weakness    Immunizations Administered    Name Date Dose VIS Date Route   Pfizer COVID-19 Vaccine 08/10/2019 12:54 PM 0.3 mL 06/12/2019 Intramuscular   Manufacturer: [employer], [employer]   Lot: [id number]   NDC: [address]-7

## 2019-08-17 DIAGNOSIS — Z299 Encounter for prophylactic measures, unspecified: Secondary | ICD-10-CM | POA: Diagnosis not present

## 2019-08-17 DIAGNOSIS — R05 Cough: Secondary | ICD-10-CM | POA: Diagnosis not present

## 2019-08-17 DIAGNOSIS — R3911 Hesitancy of micturition: Secondary | ICD-10-CM | POA: Diagnosis not present

## 2019-08-17 DIAGNOSIS — Z6824 Body mass index (BMI) 24.0-24.9, adult: Secondary | ICD-10-CM | POA: Diagnosis not present

## 2019-08-17 DIAGNOSIS — J309 Allergic rhinitis, unspecified: Secondary | ICD-10-CM | POA: Diagnosis not present

## 2019-08-17 DIAGNOSIS — I700 Atherosclerosis of aorta: Secondary | ICD-10-CM | POA: Diagnosis not present

## 2019-08-24 DIAGNOSIS — R3911 Hesitancy of micturition: Secondary | ICD-10-CM | POA: Diagnosis not present

## 2019-09-16 DIAGNOSIS — Z1339 Encounter for screening examination for other mental health and behavioral disorders: Secondary | ICD-10-CM | POA: Diagnosis not present

## 2019-09-16 DIAGNOSIS — E2839 Other primary ovarian failure: Secondary | ICD-10-CM | POA: Diagnosis not present

## 2019-09-16 DIAGNOSIS — Z299 Encounter for prophylactic measures, unspecified: Secondary | ICD-10-CM | POA: Diagnosis not present

## 2019-09-16 DIAGNOSIS — Z7189 Other specified counseling: Secondary | ICD-10-CM | POA: Diagnosis not present

## 2019-09-16 DIAGNOSIS — Z79899 Other long term (current) drug therapy: Secondary | ICD-10-CM | POA: Diagnosis not present

## 2019-09-16 DIAGNOSIS — Z1211 Encounter for screening for malignant neoplasm of colon: Secondary | ICD-10-CM | POA: Diagnosis not present

## 2019-09-16 DIAGNOSIS — Z1331 Encounter for screening for depression: Secondary | ICD-10-CM | POA: Diagnosis not present

## 2019-09-16 DIAGNOSIS — Z0000 Encounter for general adult medical examination without abnormal findings: Secondary | ICD-10-CM | POA: Diagnosis not present

## 2019-09-16 DIAGNOSIS — R05 Cough: Secondary | ICD-10-CM | POA: Diagnosis not present

## 2019-09-16 DIAGNOSIS — I700 Atherosclerosis of aorta: Secondary | ICD-10-CM | POA: Diagnosis not present

## 2019-09-21 DIAGNOSIS — N393 Stress incontinence (female) (male): Secondary | ICD-10-CM | POA: Diagnosis not present

## 2019-09-21 DIAGNOSIS — R3911 Hesitancy of micturition: Secondary | ICD-10-CM | POA: Diagnosis not present

## 2019-09-21 DIAGNOSIS — R350 Frequency of micturition: Secondary | ICD-10-CM | POA: Diagnosis not present

## 2019-09-23 ENCOUNTER — Encounter: Payer: Self-pay | Admitting: Dermatology

## 2019-09-23 ENCOUNTER — Ambulatory Visit: Payer: Medicare HMO | Admitting: Dermatology

## 2019-09-23 ENCOUNTER — Other Ambulatory Visit: Payer: Self-pay

## 2019-09-23 DIAGNOSIS — L438 Other lichen planus: Secondary | ICD-10-CM | POA: Diagnosis not present

## 2019-09-23 MED ORDER — TACROLIMUS 1 MG PO CAPS: ORAL_CAPSULE | ORAL | 5 refills | Status: DC

## 2019-09-23 MED ORDER — CLOTRIMAZOLE 10 MG MT TROC: 10.0000 mg | Freq: Three times a day (TID) | OROMUCOSAL | 5 refills | Status: DC

## 2019-09-23 NOTE — Progress Notes (Signed)
   Follow-Up Visit   Subjective  Stacy Murphy is a 69 y.o. female who presents for the following: erosive lichen planus  Patient notes improvement overall but still with burning of the gingiva. She is using the tacrolimus swish and spit every other day but often twice a day on the days she uses it. She is using the clotrimazole troches as well. She denies any difficulty with the medications. She had a dental cleaning recently and has a deep cleaning scheduled in a couple of weeks.  No other concerns.  The following portions of the chart were reviewed this encounter and updated as appropriate: Allergies  Meds      Review of Systems: No other skin or systemic complaints.  Objective  Well appearing patient in no apparent distress; mood and affect are within normal limits.  A focused examination was performed including face, lips, oropharynx, teeth, gingiva and buccal mucosa. Relevant physical exam findings are noted in the Assessment and Plan.  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 Keeping inflammation well controlled substantially reduces that risk.   Call if disease not completely controlled with increased tacrolimus swish and spit before next visit  tacrolimus (PROGRAF) 1 MG capsule - Right Mandibular Gingiva  Return in about 4 months (around 01/23/2020) for oral lichen planus.

## 2019-11-04 DIAGNOSIS — J309 Allergic rhinitis, unspecified: Secondary | ICD-10-CM | POA: Diagnosis not present

## 2019-11-04 DIAGNOSIS — R05 Cough: Secondary | ICD-10-CM | POA: Diagnosis not present

## 2019-11-04 DIAGNOSIS — Z299 Encounter for prophylactic measures, unspecified: Secondary | ICD-10-CM | POA: Diagnosis not present

## 2019-11-04 DIAGNOSIS — R0600 Dyspnea, unspecified: Secondary | ICD-10-CM | POA: Diagnosis not present

## 2019-11-04 DIAGNOSIS — I700 Atherosclerosis of aorta: Secondary | ICD-10-CM | POA: Diagnosis not present

## 2019-11-05 DIAGNOSIS — J301 Allergic rhinitis due to pollen: Secondary | ICD-10-CM | POA: Diagnosis not present

## 2019-11-11 ENCOUNTER — Ambulatory Visit: Payer: Self-pay | Admitting: Cardiology

## 2019-11-19 ENCOUNTER — Ambulatory Visit
Admission: RE | Admit: 2019-11-19 | Discharge: 2019-11-19 | Disposition: A | Discharge status: Home / Self Care | Payer: Medicare HMO | Source: Ambulatory Visit | Attending: Pulmonary Disease | Admitting: Pulmonary Disease

## 2019-11-19 ENCOUNTER — Other Ambulatory Visit: Payer: Self-pay

## 2019-11-19 ENCOUNTER — Ambulatory Visit: Payer: Medicare HMO | Admitting: Pulmonary Disease

## 2019-11-19 ENCOUNTER — Encounter: Payer: Self-pay | Admitting: Pulmonary Disease

## 2019-11-19 VITALS — BP 100/60 | HR 80 | Temp 97.3°F | Ht 62.0 in | Wt 119.4 lb

## 2019-11-19 DIAGNOSIS — R05 Cough: Secondary | ICD-10-CM | POA: Diagnosis not present

## 2019-11-19 DIAGNOSIS — R053 Chronic cough: Secondary | ICD-10-CM

## 2019-11-19 MED ORDER — ALBUTEROL SULFATE HFA 108 (90 BASE) MCG/ACT IN AERS: 2.0000 | INHALATION_SPRAY | Freq: Four times a day (QID) | RESPIRATORY_TRACT | 2 refills | Status: DC | PRN

## 2019-11-19 NOTE — Patient Instructions (Signed)
Continue using flovent 2 puffs twice per day, and rinse mouth after each use  Albuterol two puffs every 4 to 6 hours as needed for coughing, wheeze, chest congestion, or shortness of breath  Will schedule chest xray and pulmonary function test  Follow up in 6 weeks

## 2019-11-19 NOTE — Progress Notes (Signed)
[facility]  Chief Complaint  Patient presents with  . Consult    Patient is here for a dry cough that has got worse over last couple months. Patient states that she has had it for a long time about 12-14 years it comes and goes but recently got worse. Patient states if she talks a lot, eats/drinks cold stuff or works out it is worse. Patient recently given flovent inhaler and has noticed a difference    Constitutional:  BP 100/60 (BP Location: Left Arm, Patient Position: Sitting, Cuff Size: Normal)   Pulse 80   Temp (!) 97.3 F (36.3 C) (Temporal)   Ht 5\' 2"  (1.575 m)   Wt 119 lb 6.4 oz (54.2 kg)   SpO2 93%   BMI 21.84 kg/m   Past Medical History:  Allergic rhinitis, Eczema, Oral lichen planus  Summary:  NEZZIE MANERA is a 69 y.o. female with chronic cough.  Subjective:   She has noticed a cough for years.  Has allergic rhinitis and takes zyrtec.  Cough got much worse over past several weeks.  Her son is a hematology/oncology physician in [place] and knows Dr. Mortimer Fries.  After discussion with Dr. Mortimer Fries, she was started on flovent about 3 days ago.  Since using flovent her symptoms have improved.  She was getting dry cough and globus sensation.  Not having sinus congestion, sore throat, or post nasal drip.  Tried omeprazole for 2 months w/o improvement and since stopped.  She was never told that she had asthma before.  No history of pneumonia or tuberculosis.  No animal/bird exposures.  Never smoked cigarettes.  Originally from [place], but has been in [place] for about 47 years.  She was having trouble with cough and feeling short of breath when walking or doing yoga; these have improved since using flovent.  She doesn't feel like she has any issues with her breathing while asleep.  She denies fever, hemoptysis, chest pain, skin rash, leg swelling, or joint swelling.   No food allergies.  She had reaction to demeclocycline about 10 years  ago while in [place].   Physical Exam:   Appearance - well kempt  ENMT - no sinus tenderness, no nasal discharge, no oral exudate, Mallampati 2  Respiratory - no wheeze, or rales  CV - regular rate and rhythm, no murmurs  GI - soft, non tender  Lymph - no adenopathy noted in neck  Ext - no edema  Skin - no rashes  Neuro - normal strength, oriented x 3  Psych - normal mood and affect  Discussion:  She has chronic cough with history of allergic rhinitis.  She has progression of her symptoms recently.  She reports symptomatic improvement since starting flovent.  She most likely has allergic asthma.  Assessment/Plan:   Chronic cough. - most likely from allergic asthma with allergic rhinitis - continue flovent - add prn albuterol - check chest xray and pulmonary function test - depending on test results will determine if she needs additional lab work or CT chest imaging  Lichen planus. - she has been maintained on tacrolimus  A total of 39 minutes addressing patient care on the day of the visit.  Follow up:  Patient Instructions  Continue using flovent 2 puffs twice per day, and rinse mouth after each use  Albuterol two puffs every 4 to 6 hours as needed for coughing, wheeze, chest congestion, or shortness of breath  Will schedule chest xray and  pulmonary function test  Follow up in 6 weeks   Signature:  Coralyn Helling, MD [facility]Critical [facility] Pager: [phone] 11/19/2019, 11:20 AM  Flow Sheet     Pulmonary tests:    Medications:   Allergies as of 11/19/2019      Reactions   Amoxicillin       Medication List       Accurate as of Nov 19, 2019 11:20 AM. If you have any questions, ask your nurse or doctor.        STOP taking these medications   ZANTAC PO Stopped by: Coralyn Helling, MD     TAKE these medications   albuterol 108 (90 Base) MCG/ACT inhaler Commonly known as: ProAir HFA Inhale 2 puffs into the lungs every 6 (six) hours as  needed for wheezing or shortness of breath. Started by: Coralyn Helling, MD   benzonatate 200 MG capsule Commonly known as: TESSALON Take 1 capsule by mouth in the morning and at bedtime.   cetirizine 10 MG tablet Commonly known as: ZYRTEC Take 1 tablet by mouth daily.   clotrimazole 10 MG troche Commonly known as: MYCELEX Take 1 tablet (10 mg total) by mouth 3 (three) times daily.   Flovent HFA 220 MCG/ACT inhaler Generic drug: fluticasone Inhale 2 puffs into the lungs in the morning and at bedtime.   ibuprofen 200 MG tablet Commonly known as: ADVIL Take 200 mg by mouth every 6 (six) hours as needed.   OMEPRAZOLE PO Take by mouth.   tacrolimus 1 MG capsule Commonly known as: PROGRAF Dissolve one 1 mg capsule in 1 liter of water. Keep refrigerated. Swish and spit up to 4 times a day to control symptoms.       Past Surgical History:  She  has no past surgical history on file.  Family History:  Her family history is not on file.  Social History:  She  reports that she has never smoked. She has never used smokeless tobacco. She reports that she does not drink alcohol or use drugs.

## 2019-11-24 ENCOUNTER — Telehealth: Payer: Self-pay | Admitting: Pulmonary Disease

## 2019-11-24 DIAGNOSIS — J849 Interstitial pulmonary disease, unspecified: Secondary | ICD-10-CM

## 2019-11-24 NOTE — Telephone Encounter (Signed)
Called and spoke to patient she is fine with having HRCT done. Order has been placed. She has an appointment with Dr. Craige Cotta scheduled for 7/15 but would like for it to be moved up sooner. Right now there is no openings to move it up. Informed patient that she would have to call office back at a later date to see if there had been any cancellations. Patient expressed understanding. Nothing further needed at this time.

## 2019-11-24 NOTE — Telephone Encounter (Signed)
DG Chest 2 View  Result Date: 11/19/2019 CLINICAL DATA:  69 year old female with chronic cough EXAM: CHEST - 2 VIEW COMPARISON:  08/17/2019 FINDINGS: Cardiomediastinal silhouette unchanged in size and contour. Calcifications of the aortic arch. No pneumothorax. No pleural effusion. similar appearance of coarsened interstitial markings compared to the prior with no confluent airspace disease. Degenerative changes of the spine.  No acute displaced fracture. IMPRESSION: Chronic lung changes without evidence of acute cardiopulmonary disease Electronically Signed   By: Gilmer Mor D.O.   On: 11/19/2019 16:19    Please let her know her chest xray showed coarse lung markings.  This can sometimes be associated with interstitial lung disease.  To further assess this I would like to schedule her for a high resolution CT chest.  If she is agreeable, then please schedule HRCT chest.

## 2019-11-24 NOTE — Telephone Encounter (Signed)
Patient is returning phone call. Patient phone number is [phone] h or [phone] c.

## 2019-12-08 ENCOUNTER — Other Ambulatory Visit: Payer: Self-pay

## 2019-12-08 ENCOUNTER — Ambulatory Visit
Admission: RE | Admit: 2019-12-08 | Discharge: 2019-12-08 | Disposition: A | Discharge status: Home / Self Care | Payer: Medicare HMO | Source: Ambulatory Visit | Attending: Pulmonary Disease | Admitting: Pulmonary Disease

## 2019-12-08 DIAGNOSIS — J849 Interstitial pulmonary disease, unspecified: Secondary | ICD-10-CM

## 2019-12-14 ENCOUNTER — Telehealth: Payer: Self-pay | Admitting: Pulmonary Disease

## 2019-12-14 NOTE — Telephone Encounter (Signed)
HRCT chest 12/08/19 >> mild air trapping  Please let her know her CT chest showed air trapping which is related to asthma.  No other worrisome findings.  She should continue inhaler therapy with flovent and prn albuterol.

## 2019-12-14 NOTE — Telephone Encounter (Signed)
Left message for patient to call back  

## 2019-12-16 ENCOUNTER — Other Ambulatory Visit
Admission: RE | Admit: 2019-12-16 | Discharge: 2019-12-16 | Disposition: A | Discharge status: Home / Self Care | Payer: Medicare HMO | Source: Ambulatory Visit | Attending: Pulmonary Disease | Admitting: Pulmonary Disease

## 2019-12-16 ENCOUNTER — Other Ambulatory Visit: Payer: Self-pay

## 2019-12-16 DIAGNOSIS — Z20822 Contact with and (suspected) exposure to covid-19: Secondary | ICD-10-CM | POA: Insufficient documentation

## 2019-12-16 DIAGNOSIS — Z01812 Encounter for preprocedural laboratory examination: Secondary | ICD-10-CM | POA: Diagnosis not present

## 2019-12-17 ENCOUNTER — Ambulatory Visit: Payer: Medicare HMO | Attending: Pulmonary Disease

## 2019-12-17 DIAGNOSIS — R05 Cough: Secondary | ICD-10-CM | POA: Insufficient documentation

## 2019-12-17 DIAGNOSIS — R053 Chronic cough: Secondary | ICD-10-CM

## 2019-12-17 LAB — SARS CORONAVIRUS 2 (TAT 6-24 HRS): SARS Coronavirus 2: NEGATIVE

## 2019-12-17 MED ORDER — ALBUTEROL SULFATE (2.5 MG/3ML) 0.083% IN NEBU
2.5000 mg | INHALATION_SOLUTION | Freq: Once | RESPIRATORY_TRACT | Status: AC
  Administered 2019-12-17: 2.5 mg via RESPIRATORY_TRACT
  Filled 2019-12-17: qty 3

## 2020-01-14 ENCOUNTER — Ambulatory Visit: Payer: Medicare HMO | Admitting: Pulmonary Disease

## 2020-01-14 ENCOUNTER — Encounter: Payer: Self-pay | Admitting: Pulmonary Disease

## 2020-01-14 ENCOUNTER — Other Ambulatory Visit: Payer: Self-pay

## 2020-01-14 VITALS — BP 106/64 | HR 74 | Temp 97.3°F | Ht 62.0 in | Wt 124.8 lb

## 2020-01-14 DIAGNOSIS — J4540 Moderate persistent asthma, uncomplicated: Secondary | ICD-10-CM | POA: Diagnosis not present

## 2020-01-14 MED ORDER — FLOVENT HFA 110 MCG/ACT IN AERO
2.0000 | INHALATION_SPRAY | Freq: Two times a day (BID) | RESPIRATORY_TRACT | 12 refills | Status: DC
Start: 2020-01-14 — End: 2020-06-02

## 2020-01-14 NOTE — Patient Instructions (Addendum)
Finish your current inhaler of Flovent 220 mcg two puffs twice per day.  Once this is finished, then change to Flovent 110 mcg two puffs twice per day.  Follow up in 2 to 3 months

## 2020-01-14 NOTE — Progress Notes (Signed)
[facility]  Chief Complaint  Patient presents with  . Follow-up    reciew PFT and CT--breathing is doing well. no current sx.     Constitutional:  BP 106/64 (BP Location: Left Arm, Cuff Size: Normal)   Pulse 74   Temp (!) 97.3 F (36.3 C) (Temporal)   Ht 5\' 2"  (1.575 m)   Wt 124 lb 12.8 oz (56.6 kg)   SpO2 98%   BMI 22.83 kg/m   Past Medical History:  Allergic rhinitis, Eczema, Oral lichen planus  Summary:  Stacy Murphy is a 69 y.o. female with asthma.  Subjective:  Since her last visit she had CT chest and pulmonary function test.  CT chest showed small airway disease with air trapping consistent with asthma.  PFT showed mild restriction.  She also had diffusion defect, but corrected for lung volumes.  Her cough is much better.  Not bringing up sputum.  Denies wheezing, chest pain.  Doesn't feel her breathing limits her activities.  Hasn't needed to use albuterol much.  Physical Exam:   Appearance - well kempt   ENMT - no sinus tenderness, no oral exudate, no LAN, Mallampati 3 airway, no stridor  Respiratory - equal breath sounds bilaterally, no wheezing or rales  CV - s1s2 regular rate and rhythm, no murmurs  Ext - no clubbing, no edema  Skin - no rashes  Psych - normal mood and affect   Assessment/Plan:   Moderate, persistent asthma. - will have her finish her current script for flovent 220 mcg two puffs bid - she will then change to flovent 110 mcg two puffs bid - continue prn albuterol - at next visit if she remains stable, then could consider either changing to montelukast or just prn albuterol  Mild restrictive defect on PFT. - not sure significance of this, and might be related to not having normative values of [place] as comparison for PFT values  Lichen planus. - she has been maintained on tacrolimus  A total of  22 minutes spent addressing patient care issues on day of visit.  Follow up:   Patient Instructions  Finish your current inhaler of Flovent 220 mcg two puffs twice per day.  Once this is finished, then change to Flovent 110 mcg two puffs twice per day.  Follow up in 2 to 3 months   Signature:  73, MD [facility] Pager: [phone] 01/14/2020, 11:34 AM  Flow Sheet     Pulmonary tests:   PFT 12/17/19 >> FEV1 1.96 (91%), FEV1% 89, TLC 3.46 (72%), DLCO 58% corrects for lung volumes, no BD  Chest imaging:   HRCT chest 12/08/19 >> mild air trapping  Medications:   Allergies as of 01/14/2020      Reactions   Amoxicillin       Medication List       Accurate as of January 14, 2020 11:34 AM. If you have any questions, ask your nurse or doctor.        STOP taking these medications   benzonatate 200 MG capsule Commonly known as: TESSALON Stopped by: January 16, 2020, MD   Flovent HFA 220 MCG/ACT inhaler Generic drug: fluticasone Replaced by: Flovent HFA 110 MCG/ACT inhaler Stopped by: Coralyn Helling, MD   ibuprofen 200 MG tablet Commonly known as: ADVIL Stopped by: Coralyn Helling, MD   OMEPRAZOLE PO Stopped by: Coralyn Helling, MD     TAKE these medications   albuterol 108 (90 Base) MCG/ACT inhaler  Commonly known as: ProAir HFA Inhale 2 puffs into the lungs every 6 (six) hours as needed for wheezing or shortness of breath.   cetirizine 10 MG tablet Commonly known as: ZYRTEC Take 1 tablet by mouth daily.   clotrimazole 10 MG troche Commonly known as: MYCELEX Take 1 tablet (10 mg total) by mouth 3 (three) times daily.   Flovent HFA 110 MCG/ACT inhaler Generic drug: fluticasone Inhale 2 puffs into the lungs in the morning and at bedtime. Replaces: Flovent HFA 220 MCG/ACT inhaler Started by: Coralyn Helling, MD   tacrolimus 1 MG capsule Commonly known as: PROGRAF Dissolve one 1 mg capsule in 1 liter of water. Keep refrigerated. Swish and spit up to 4 times a day to control symptoms.       Past Surgical History:  She  has  no past surgical history on file.  Family History:  Her family history is not on file.  Social History:  She  reports that she has never smoked. She has never used smokeless tobacco. She reports that she does not drink alcohol and does not use drugs.

## 2020-01-18 DIAGNOSIS — M779 Enthesopathy, unspecified: Secondary | ICD-10-CM | POA: Diagnosis not present

## 2020-01-18 DIAGNOSIS — I700 Atherosclerosis of aorta: Secondary | ICD-10-CM | POA: Diagnosis not present

## 2020-01-18 DIAGNOSIS — Z299 Encounter for prophylactic measures, unspecified: Secondary | ICD-10-CM | POA: Diagnosis not present

## 2020-01-18 DIAGNOSIS — J4540 Moderate persistent asthma, uncomplicated: Secondary | ICD-10-CM | POA: Diagnosis not present

## 2020-01-18 DIAGNOSIS — R05 Cough: Secondary | ICD-10-CM | POA: Diagnosis not present

## 2020-02-04 ENCOUNTER — Ambulatory Visit: Payer: Medicare HMO | Admitting: Dermatology

## 2020-02-04 ENCOUNTER — Other Ambulatory Visit: Payer: Self-pay

## 2020-02-04 DIAGNOSIS — L308 Other specified dermatitis: Secondary | ICD-10-CM | POA: Diagnosis not present

## 2020-02-04 DIAGNOSIS — L438 Other lichen planus: Secondary | ICD-10-CM

## 2020-02-04 MED ORDER — TRIAMCINOLONE ACETONIDE 0.1 % EX OINT: 1.0000 "application " | TOPICAL_OINTMENT | Freq: Two times a day (BID) | CUTANEOUS | 1 refills | Status: DC | PRN

## 2020-02-04 NOTE — Progress Notes (Signed)
   Follow-Up Visit   Subjective  Stacy Murphy is a 69 y.o. female who presents for the following: Follow-up (oral lichen planus).  Patient here today for biopsy proven lichen planus follow up. She is using tacrolimus swish and spit and clotrimazole troche, alternating daily. She advises it has not resolved but has had some improvement. It does sometimes hurt when she is flossing her teeth.  She would also like some dry skin at left arm and left lower leg looked at.  The following portions of the chart were reviewed this encounter and updated as appropriate:  Tobacco  Allergies  Meds  Problems  Med Hx  Surg Hx  Fam Hx      Review of Systems:  No other skin or systemic complaints except as noted in HPI or Assessment and Plan.  Objective  Well appearing patient in no apparent distress; mood and affect are within normal limits.  A focused examination was performed including extremities, including the arms, hands, fingers, and fingernails and the legs, feet, toes, and toenails and face, mouth, arms, lower legs. Relevant physical exam findings are noted in the Assessment and Plan.  Objective  Lower Legs, arms: Scaly pink papules coalescing to plaques   Objective  Right Mandibular Gingiva: Right gingival erosions   Assessment & Plan  Other eczema Lower Legs, arms  Start TMC 0.1% ointment twice daily until clear then as needed for flares. Avoid face, groin, axilla.   Topical steroids (such as triamcinolone, fluocinolone, fluocinonide, mometasone, clobetasol, halobetasol, betamethasone, hydrocortisone) can cause thinning and lightening of the skin if they are used for too long in the same area. Your physician has selected the right strength medicine for your problem and area affected on the body. Please use your medication only as directed by your physician to prevent side effects.    Ordered Medications: triamcinolone ointment (KENALOG) 0.1 %  Erosive oral lichen  planus Right Mandibular Gingiva  Chronic, flared.  Increase clotrimazole troche to daily  Increase tacrolimus swish and spit up to three times daily as needed. This should only be used as directed and should not be swallowed due to risk of immunosuppression.   If not getting complete clearance with this, may need to consider systemic therapy. Reviewed risk of cancer if inflammation is not well controlled.   Other Related Medications tacrolimus (PROGRAF) 1 MG capsule  Return in about 2 months (around 04/05/2020).  Anise Salvo, RMA, am acting as scribe for Darden Dates, MD .  Documentation: I have reviewed the above documentation for accuracy and completeness, and I agree with the above.  Darden Dates, MD

## 2020-02-04 NOTE — Patient Instructions (Signed)
Topical steroids (such as triamcinolone, fluocinolone, fluocinonide, mometasone, clobetasol, halobetasol, betamethasone, hydrocortisone) can cause thinning and lightening of the skin if they are used for too long in the same area. Your physician has selected the right strength medicine for your problem and area affected on the body. Please use your medication only as directed by your physician to prevent side effects.   . 

## 2020-02-21 ENCOUNTER — Encounter: Payer: Self-pay | Admitting: Dermatology

## 2020-04-14 ENCOUNTER — Other Ambulatory Visit: Payer: Self-pay

## 2020-04-14 ENCOUNTER — Encounter: Payer: Self-pay | Admitting: Dermatology

## 2020-04-14 ENCOUNTER — Ambulatory Visit: Payer: Medicare HMO | Admitting: Dermatology

## 2020-04-14 DIAGNOSIS — L438 Other lichen planus: Secondary | ICD-10-CM | POA: Diagnosis not present

## 2020-04-14 DIAGNOSIS — L309 Dermatitis, unspecified: Secondary | ICD-10-CM

## 2020-04-14 DIAGNOSIS — I872 Venous insufficiency (chronic) (peripheral): Secondary | ICD-10-CM | POA: Diagnosis not present

## 2020-04-14 MED ORDER — CLOBETASOL PROPIONATE 0.05 % EX OINT: 1.0000 "application " | TOPICAL_OINTMENT | Freq: Two times a day (BID) | CUTANEOUS | 2 refills | Status: DC

## 2020-04-14 NOTE — Patient Instructions (Addendum)
Recommend daily broad spectrum sunscreen SPF 30+ to sun-exposed areas, reapply every 2 hours as needed. Call for new or changing lesions.   Compression stocking:  [email or web address]   And [facility] [place], [place]

## 2020-04-14 NOTE — Progress Notes (Signed)
   Follow-Up Visit   Subjective  Stacy Murphy is a 69 y.o. female who presents for the following: Follow-up (Oral Lichen Planus).  Patient here today for biopsy proven lichen planus follow up. She is using tacrolimus swish and spit and clotrimazole troche, alternating daily. She advises it has not resolved but has had some improvement. It does sometimes hurt a little when she is flossing her teeth. Patient also states that Eczema on extremities is about same but itching is gone. Patient is using TMC 0.1% ointment BID  The following portions of the chart were reviewed this encounter and updated as appropriate:  Tobacco  Allergies  Meds  Problems  Med Hx  Surg Hx  Fam Hx      Review of Systems:  No other skin or systemic complaints except as noted in HPI or Assessment and Plan.  Objective  Well appearing patient in no apparent distress; mood and affect are within normal limits.  A focused examination was performed including extremities, including the arms, hands, fingers, and fingernails and the legs, and mouth. Relevant physical exam findings are noted in the Assessment and Plan.  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  If she has any flares, recommend increasing frequency again.  Continue Clotrimazole troche daily  Venous stasis dermatitis of left lower extremity Left Lower Leg - Anterior  Reviewed chronic nature of condition.  Currently flared.  Stressed need to reduce swelling to prevent flaring of rash.    Recommend starting compression stockings 20 - 30 mmHg Start Clobetasol ointment  to affected area on left lower leg. Avoid applying to face, groin, and axilla. Use as directed. Risk of skin atrophy with long-term use reviewed.   Topical steroids (such as triamcinolone, fluocinolone, fluocinonide, mometasone, clobetasol, halobetasol, betamethasone, hydrocortisone) can cause thinning and lightening of the skin if they are used for too long in the same area. Your physician has selected the right strength medicine for your problem and area affected on the body. Please use your medication only as directed by your physician to prevent side effects.    Dermatitis Left Forearm - Anterior  Start clobetasol ointment BID for 2 week then stop. Avoid applying to face, groin, and axilla. Use as directed. Risk of skin atrophy with long-term use reviewed.   Topical steroids (such as triamcinolone, fluocinolone, fluocinonide, mometasone, clobetasol, halobetasol, betamethasone, hydrocortisone) can cause thinning and lightening of the skin if they are used for too long in the same area. Your physician has selected the right strength medicine for your problem and area affected on the body. Please use your medication only as directed by your physician to prevent side effects.     clobetasol ointment (TEMOVATE) 0.05 % - Left Forearm - Anterior  Return in about 2 months (around 06/14/2020) for Oral Lichen Planus and Eczema.  ILeward Quan, CMA, am acting as scribe for Darden Dates, MD .  Documentation: I have reviewed the above documentation for accuracy and completeness, and I agree with the above.  Darden Dates, MD

## 2020-04-21 ENCOUNTER — Ambulatory Visit: Payer: Medicare HMO | Admitting: Pulmonary Disease

## 2020-04-21 DIAGNOSIS — Z20822 Contact with and (suspected) exposure to covid-19: Secondary | ICD-10-CM | POA: Diagnosis not present

## 2020-04-26 ENCOUNTER — Encounter: Payer: Self-pay | Admitting: Dermatology

## 2020-04-28 ENCOUNTER — Ambulatory Visit: Payer: Medicare HMO | Admitting: Pulmonary Disease

## 2020-05-06 DIAGNOSIS — H2513 Age-related nuclear cataract, bilateral: Secondary | ICD-10-CM | POA: Diagnosis not present

## 2020-06-02 ENCOUNTER — Ambulatory Visit: Payer: Medicare HMO | Admitting: Pulmonary Disease

## 2020-06-02 ENCOUNTER — Encounter: Payer: Self-pay | Admitting: Pulmonary Disease

## 2020-06-02 ENCOUNTER — Other Ambulatory Visit: Payer: Self-pay

## 2020-06-02 VITALS — BP 109/66 | HR 79 | Temp 97.1°F | Ht 62.0 in | Wt 130.6 lb

## 2020-06-02 DIAGNOSIS — J4520 Mild intermittent asthma, uncomplicated: Secondary | ICD-10-CM

## 2020-06-02 NOTE — Progress Notes (Signed)
[facility]  Chief Complaint  Patient presents with  . Follow-up    no concerns    Constitutional:  BP 109/66 (BP Location: Right Arm, Patient Position: Sitting, Cuff Size: Normal)   Pulse 79   Temp (!) 97.1 F (36.2 C) (Temporal)   Ht 5\' 2"  (1.575 m)   Wt 130 lb 9.6 oz (59.2 kg)   SpO2 99%   BMI 23.89 kg/m   Past Medical History:  Allergic rhinitis, Eczema, Oral lichen planus  Past Surgical History:  Her  has no past surgical history on file.  Brief Summary:  Stacy Murphy is a 69 y.o. female with asthma.      Subjective:   She has been doing better since last visit.  No longer using inhalers.  Last used in October 2021.  Not having cough, wheeze, sputum, or chest congestion.  No issues with breathing while asleep.  No longer on medication for lichen planus either.  Physical Exam:   Appearance - well kempt   ENMT - no sinus tenderness, no oral exudate, no LAN, Mallampati 2 airway, no stridor  Respiratory - equal breath sounds bilaterally, no wheezing or rales  CV - s1s2 regular rate and rhythm, no murmurs  Ext - no clubbing, no edema  Skin - no rashes  Psych - normal mood and affect   Pulmonary testing:   PFT 12/17/19 >> FEV1 1.96 (91%), FEV1% 89, TLC 3.46 (72%), DLCO 58% corrects for lung volumes, no BD  Chest Imaging:   HRCT chest 12/08/19 >> mild air trapping  Social History:  She  reports that she has never smoked. She has never used smokeless tobacco. She reports that she does not drink alcohol and does not use drugs.  Family History:  Her family history is not on file.     Assessment/Plan:   Mild, intermittent asthma. - no longer needing flovent - continue prn albuterol - will have her f/u in late Spring to early Summer   Mild restrictive defect on PFT. - not sure significance of this, and might be related to not having normative values of [place] as comparison for PFT  values  Lichen planus. - has been transitioned off tacrolimus  Time Spent Involved in Patient Care on Day of Examination:  12 minutes  Follow up:  Patient Instructions  Follow up in 6 months   Medication List:   Allergies as of 06/02/2020      Reactions   Amoxicillin       Medication List       Accurate as of June 02, 2020 10:18 AM. If you have any questions, ask your nurse or doctor.        STOP taking these medications   cetirizine 10 MG tablet Commonly known as: ZYRTEC Stopped by: June 04, 2020, MD   clobetasol ointment 0.05 % Commonly known as: TEMOVATE Stopped by: Coralyn Helling, MD   clotrimazole 10 MG troche Commonly known as: MYCELEX Stopped by: Coralyn Helling, MD   Flovent HFA 110 MCG/ACT inhaler Generic drug: fluticasone Stopped by: Coralyn Helling, MD   omeprazole 20 MG capsule Commonly known as: PRILOSEC Stopped by: Coralyn Helling, MD   tacrolimus 1 MG capsule Commonly known as: PROGRAF Stopped by: Coralyn Helling, MD     TAKE these medications   albuterol 108 (90 Base) MCG/ACT inhaler Commonly known as: ProAir HFA Inhale 2 puffs into the lungs every 6 (six) hours as needed for wheezing or shortness of  breath.   triamcinolone 0.1 % Commonly known as: KENALOG What changed: Another medication with the same name was removed. Continue taking this medication, and follow the directions you see here. Changed by: Coralyn Helling, MD       Signature:  Coralyn Helling, MD [facility] Pager - [phone] 06/02/2020, 10:18 AM

## 2020-06-02 NOTE — Patient Instructions (Signed)
Follow up in 6 months 

## 2020-06-16 ENCOUNTER — Other Ambulatory Visit: Payer: Self-pay

## 2020-06-16 ENCOUNTER — Ambulatory Visit: Payer: Medicare HMO | Admitting: Dermatology

## 2020-06-16 DIAGNOSIS — L309 Dermatitis, unspecified: Secondary | ICD-10-CM

## 2020-06-16 DIAGNOSIS — I872 Venous insufficiency (chronic) (peripheral): Secondary | ICD-10-CM | POA: Diagnosis not present

## 2020-06-16 DIAGNOSIS — L438 Other lichen planus: Secondary | ICD-10-CM

## 2020-06-16 NOTE — Patient Instructions (Addendum)

## 2020-06-16 NOTE — Progress Notes (Addendum)
   Follow-Up Visit   Subjective  Stacy Murphy is a 69 y.o. female who presents for the following: Follow-up (F/u for oral lichen planus and dermatitis.  ).  Pt states that she has had improvement. States that she stopped the tacrolimus swish and spit as her mouth improved. Using compression and clobetasol ointment for the venous stasis derm on left leg and clobetasol for the left forearm.   The following portions of the chart were reviewed this encounter and updated as appropriate:  Tobacco  Allergies  Meds  Problems  Med Hx  Surg Hx  Fam Hx      Review of Systems: No other skin or systemic complaints except as noted in HPI or Assessment and Plan.   Objective  Well appearing patient in no apparent distress; mood and affect are within normal limits.  A focused examination was performed including face and legs. Relevant physical exam findings are noted in the Assessment and Plan.  Objective  R and L mandibular gingiva: Clear today.  Objective  Right Hand - Anterior: Scaly pink plaques at hands  Objective  bilateral legs: Hyperpigmented patches  Assessment & Plan  Oral lichen planus R and L mandibular gingiva  Chronic, well controlled.   When recurs, restart tacrolimus swish daily and clotrimazole troches until clear. Call if not well-controlled with treatment. Reviewed risk of SCC arising in erosive oral lichen planus and that good control of inflammation as she currently has dramatically lowers that risk.  Advised if she develops new or changing lesions in the mouth then they need to be checked.  Dermatitis Right Hand - Anterior  Recommend gentle skin care. When flared, use Clobetasol ointment once to twice a day as needed up to 2 weeks at a time. Avoid applying to face, groin, and axilla. Use as directed. Risk of skin atrophy with long-term use reviewed.    Can cover with cotton gloves at night.  Gentle skin care handout given.   Topical steroids (such as  triamcinolone, fluocinolone, fluocinonide, mometasone, clobetasol, halobetasol, betamethasone, hydrocortisone) can cause thinning and lightening of the skin if they are used for too long in the same area. Your physician has selected the right strength medicine for your problem and area affected on the body. Please use your medication only as directed by your physician to prevent side effects.     Venous stasis dermatitis of both lower extremities bilateral legs  Chronic condition with expected duration over one year. There is no cure, only control. Currently well-controlled.  Decrease clobetasol use to only when flared.  Avoid applying to face, groin, and axilla. Use as directed. Risk of skin atrophy with long-term use reviewed.   Continue compression stockings 20-30 mmHg daily.    Return in about 6 months (around 12/15/2020).   I, Epifania Gore, CMA, am acting as scribe for Darden Dates, MD.   Documentation: I have reviewed the above documentation for accuracy and completeness, and I agree with the above.  Darden Dates, MD

## 2020-06-21 ENCOUNTER — Encounter: Payer: Self-pay | Admitting: Dermatology

## 2020-07-08 ENCOUNTER — Other Ambulatory Visit: Payer: Medicare HMO

## 2020-07-08 DIAGNOSIS — Z20822 Contact with and (suspected) exposure to covid-19: Secondary | ICD-10-CM | POA: Diagnosis not present

## 2020-07-12 LAB — NOVEL CORONAVIRUS, NAA: SARS-CoV-2, NAA: NOT DETECTED

## 2020-10-04 DIAGNOSIS — Z7189 Other specified counseling: Secondary | ICD-10-CM | POA: Diagnosis not present

## 2020-10-04 DIAGNOSIS — Z6826 Body mass index (BMI) 26.0-26.9, adult: Secondary | ICD-10-CM | POA: Diagnosis not present

## 2020-10-04 DIAGNOSIS — E7800 Pure hypercholesterolemia, unspecified: Secondary | ICD-10-CM | POA: Diagnosis not present

## 2020-10-04 DIAGNOSIS — Z1339 Encounter for screening examination for other mental health and behavioral disorders: Secondary | ICD-10-CM | POA: Diagnosis not present

## 2020-10-04 DIAGNOSIS — Z79899 Other long term (current) drug therapy: Secondary | ICD-10-CM | POA: Diagnosis not present

## 2020-10-04 DIAGNOSIS — Z299 Encounter for prophylactic measures, unspecified: Secondary | ICD-10-CM | POA: Diagnosis not present

## 2020-10-04 DIAGNOSIS — Z1331 Encounter for screening for depression: Secondary | ICD-10-CM | POA: Diagnosis not present

## 2020-10-04 DIAGNOSIS — Z0000 Encounter for general adult medical examination without abnormal findings: Secondary | ICD-10-CM | POA: Diagnosis not present

## 2020-10-04 DIAGNOSIS — R5383 Other fatigue: Secondary | ICD-10-CM | POA: Diagnosis not present

## 2020-12-08 ENCOUNTER — Other Ambulatory Visit: Payer: Self-pay

## 2020-12-08 ENCOUNTER — Other Ambulatory Visit: Payer: Self-pay | Admitting: Internal Medicine

## 2020-12-08 DIAGNOSIS — Z1231 Encounter for screening mammogram for malignant neoplasm of breast: Secondary | ICD-10-CM

## 2020-12-14 ENCOUNTER — Other Ambulatory Visit: Payer: Self-pay

## 2020-12-14 ENCOUNTER — Ambulatory Visit
Admission: RE | Admit: 2020-12-14 | Discharge: 2020-12-14 | Disposition: A | Discharge status: Home / Self Care | Payer: Medicare HMO | Source: Ambulatory Visit | Attending: Internal Medicine | Admitting: Internal Medicine

## 2020-12-14 DIAGNOSIS — Z1231 Encounter for screening mammogram for malignant neoplasm of breast: Secondary | ICD-10-CM | POA: Diagnosis not present

## 2020-12-15 ENCOUNTER — Ambulatory Visit: Payer: Medicare HMO | Admitting: Dermatology

## 2020-12-20 DIAGNOSIS — Z79899 Other long term (current) drug therapy: Secondary | ICD-10-CM | POA: Diagnosis not present

## 2020-12-20 DIAGNOSIS — M810 Age-related osteoporosis without current pathological fracture: Secondary | ICD-10-CM | POA: Diagnosis not present

## 2020-12-20 DIAGNOSIS — I700 Atherosclerosis of aorta: Secondary | ICD-10-CM | POA: Diagnosis not present

## 2020-12-20 DIAGNOSIS — Z299 Encounter for prophylactic measures, unspecified: Secondary | ICD-10-CM | POA: Diagnosis not present

## 2020-12-20 DIAGNOSIS — E2839 Other primary ovarian failure: Secondary | ICD-10-CM | POA: Diagnosis not present

## 2020-12-20 DIAGNOSIS — E875 Hyperkalemia: Secondary | ICD-10-CM | POA: Diagnosis not present

## 2021-01-25 DIAGNOSIS — I700 Atherosclerosis of aorta: Secondary | ICD-10-CM | POA: Diagnosis not present

## 2021-01-25 DIAGNOSIS — Z299 Encounter for prophylactic measures, unspecified: Secondary | ICD-10-CM | POA: Diagnosis not present

## 2021-01-25 DIAGNOSIS — L84 Corns and callosities: Secondary | ICD-10-CM | POA: Diagnosis not present

## 2021-02-09 ENCOUNTER — Ambulatory Visit: Payer: Medicare HMO | Admitting: Podiatry

## 2021-02-09 ENCOUNTER — Encounter: Payer: Self-pay | Admitting: Podiatry

## 2021-02-09 ENCOUNTER — Other Ambulatory Visit: Payer: Self-pay

## 2021-02-09 ENCOUNTER — Ambulatory Visit (INDEPENDENT_AMBULATORY_CARE_PROVIDER_SITE_OTHER): Payer: Medicare HMO

## 2021-02-09 VITALS — BP 103/61 | HR 78 | Temp 98.7°F | Resp 16

## 2021-02-09 DIAGNOSIS — M722 Plantar fascial fibromatosis: Secondary | ICD-10-CM | POA: Diagnosis not present

## 2021-02-09 DIAGNOSIS — M2041 Other hammer toe(s) (acquired), right foot: Secondary | ICD-10-CM

## 2021-02-09 DIAGNOSIS — Q828 Other specified congenital malformations of skin: Secondary | ICD-10-CM | POA: Diagnosis not present

## 2021-02-09 NOTE — Progress Notes (Signed)
  Subjective:  Patient ID: Stacy Murphy, female    DOB: August 19, 1950,  MRN: [id number]  Chief Complaint  Patient presents with   Foot Pain    "The toe hurts right here (5th rt) and it burns here (arch) on the left foot."    70 y.o. female presents with the above complaint.  Patient presents with complaint of right fifth digit porokeratosis.  Patient states it painful to touch painful to walk on.  She would like to get it evaluated.  She has not seen anyone prior to seeing me.  She denies any other acute complaints.  She would like to discuss treatment options for it.  She has not done any shoe gear modification.  She has not done anything else.   Review of Systems: Negative except as noted in the HPI. Denies N/V/F/Ch.  Past Medical History:  Diagnosis Date   Erosive oral lichen planus     Current Outpatient Medications:    alendronate (FOSAMAX) 70 MG tablet, Take 70 mg by mouth once a week. Take with a full glass of water on an empty stomach., Disp: , Rfl:    Vitamin D, Ergocalciferol, (DRISDOL) 1.25 MG (50000 UNIT) CAPS capsule, Take 50,000 Units by mouth every 7 (seven) days., Disp: , Rfl:    albuterol (PROAIR HFA) 108 (90 Base) MCG/ACT inhaler, Inhale 2 puffs into the lungs every 6 (six) hours as needed for wheezing or shortness of breath. (Patient not taking: Reported on 06/02/2020), Disp: 6.7 g, Rfl: 2   triamcinolone cream (KENALOG) 0.1 %, , Disp: , Rfl:   Social History   Tobacco Use  Smoking Status Never  Smokeless Tobacco Never    Allergies  Allergen Reactions   Amoxicillin    Objective:   Vitals:   02/09/21 1318  BP: 103/61  Pulse: 78  Resp: 16  Temp: 98.7 F (37.1 C)   There is no height or weight on file to calculate BMI. Constitutional Well developed. Well nourished.  Vascular Dorsalis pedis pulses palpable bilaterally. Posterior tibial pulses palpable bilaterally. Capillary refill normal to all digits.  No cyanosis or clubbing noted. Pedal hair  growth normal.  Neurologic Normal speech. Oriented to person, place, and time. Epicritic sensation to light touch grossly present bilaterally.  Dermatologic Pain on palpation right porokeratotic lesion with central nucleated core.  Pain on palpation to the lesion.  The lesion is located on right fifth digit.  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 No visible deformities. No bony tenderness.   Radiographs: None Assessment:   1. Hammer toe of right foot   2. Porokeratosis    Plan:  Patient was evaluated and treated and all questions answered.  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  Given the amount of pain that she is having I believe she will benefit from debridement of the lesion.  Using chisel blade and handle the lesion was debrided to healthy striated tissue followed by excision of central nucleated core.  No pinpoint bleeding noted no complication noted. -Toe protectors were dispensed  No follow-ups on file.

## 2021-05-09 DIAGNOSIS — Z0100 Encounter for examination of eyes and vision without abnormal findings: Secondary | ICD-10-CM | POA: Diagnosis not present

## 2021-05-09 DIAGNOSIS — H2513 Age-related nuclear cataract, bilateral: Secondary | ICD-10-CM | POA: Diagnosis not present

## 2021-06-07 DIAGNOSIS — M79674 Pain in right toe(s): Secondary | ICD-10-CM | POA: Diagnosis not present

## 2021-06-07 DIAGNOSIS — M2042 Other hammer toe(s) (acquired), left foot: Secondary | ICD-10-CM | POA: Diagnosis not present

## 2021-06-07 DIAGNOSIS — M898X9 Other specified disorders of bone, unspecified site: Secondary | ICD-10-CM | POA: Diagnosis not present

## 2021-06-07 DIAGNOSIS — M2041 Other hammer toe(s) (acquired), right foot: Secondary | ICD-10-CM | POA: Diagnosis not present

## 2021-08-10 DIAGNOSIS — M898X9 Other specified disorders of bone, unspecified site: Secondary | ICD-10-CM | POA: Diagnosis not present

## 2021-08-10 DIAGNOSIS — M2042 Other hammer toe(s) (acquired), left foot: Secondary | ICD-10-CM | POA: Diagnosis not present

## 2021-08-10 DIAGNOSIS — M2041 Other hammer toe(s) (acquired), right foot: Secondary | ICD-10-CM | POA: Diagnosis not present

## 2021-08-11 DIAGNOSIS — Z01818 Encounter for other preprocedural examination: Secondary | ICD-10-CM | POA: Diagnosis not present

## 2021-08-11 DIAGNOSIS — Z299 Encounter for prophylactic measures, unspecified: Secondary | ICD-10-CM | POA: Diagnosis not present

## 2021-08-11 DIAGNOSIS — I700 Atherosclerosis of aorta: Secondary | ICD-10-CM | POA: Diagnosis not present

## 2021-08-31 ENCOUNTER — Other Ambulatory Visit: Payer: Self-pay | Admitting: Podiatry

## 2021-08-31 DIAGNOSIS — M898X9 Other specified disorders of bone, unspecified site: Secondary | ICD-10-CM | POA: Diagnosis not present

## 2021-08-31 DIAGNOSIS — M2041 Other hammer toe(s) (acquired), right foot: Secondary | ICD-10-CM | POA: Diagnosis not present

## 2021-08-31 DIAGNOSIS — M2042 Other hammer toe(s) (acquired), left foot: Secondary | ICD-10-CM | POA: Diagnosis not present

## 2021-09-04 ENCOUNTER — Other Ambulatory Visit: Payer: Self-pay

## 2021-09-04 ENCOUNTER — Encounter: Payer: Self-pay | Admitting: Urgent Care

## 2021-09-04 ENCOUNTER — Encounter
Admission: RE | Admit: 2021-09-04 | Discharge: 2021-09-04 | Disposition: A | Discharge status: Home / Self Care | Payer: Medicare HMO | Source: Ambulatory Visit | Attending: Podiatry | Admitting: Podiatry

## 2021-09-04 VITALS — BP 110/67 | HR 83 | Resp 18 | Ht 62.0 in | Wt 135.0 lb

## 2021-09-04 DIAGNOSIS — G459 Transient cerebral ischemic attack, unspecified: Secondary | ICD-10-CM | POA: Diagnosis not present

## 2021-09-04 DIAGNOSIS — Z8639 Personal history of other endocrine, nutritional and metabolic disease: Secondary | ICD-10-CM

## 2021-09-04 DIAGNOSIS — R54 Age-related physical debility: Secondary | ICD-10-CM | POA: Diagnosis not present

## 2021-09-04 DIAGNOSIS — Z01818 Encounter for other preprocedural examination: Secondary | ICD-10-CM | POA: Insufficient documentation

## 2021-09-04 HISTORY — DX: Age-related osteoporosis without current pathological fracture: M81.0

## 2021-09-04 LAB — BASIC METABOLIC PANEL
Anion gap: 7 (ref 5–15)
BUN: 18 mg/dL (ref 8–23)
CO2: 26 mmol/L (ref 22–32)
Calcium: 10 mg/dL (ref 8.9–10.3)
Chloride: 103 mmol/L (ref 98–111)
Creatinine, Ser: 0.76 mg/dL (ref 0.44–1.00)
GFR, Estimated: >60 mL/min (ref >60)
Glucose, Bld: 87 mg/dL (ref 70–99)
Potassium: 4.2 mmol/L (ref 3.5–5.1)
Sodium: 136 mmol/L (ref 135–145)

## 2021-09-04 LAB — CBC
HCT: 39.5 % (ref 36.0–46.0)
Hemoglobin: 12.9 g/dL (ref 12.0–15.0)
MCH: 30.8 pg (ref 26.0–34.0)
MCHC: 32.7 g/dL (ref 30.0–36.0)
MCV: 94.3 fL (ref 80.0–100.0)
Platelets: 218 10*3/uL (ref 150–400)
RBC: 4.19 MIL/uL (ref 3.87–5.11)
RDW: 11.9 % (ref 11.5–15.5)
WBC: 6.4 10*3/uL (ref 4.0–10.5)
nRBC: 0 % (ref 0.0–0.2)

## 2021-09-04 NOTE — Patient Instructions (Addendum)
Your procedure is scheduled on: 09/08/21 Report to DAY SURGERY DEPARTMENT LOCATED ON 2ND FLOOR MEDICAL MALL ENTRANCE. To find out your arrival time please call [phone] between 1PM - 3PM on 09/07/21.  Remember: Instructions that are not followed completely may result in serious medical risk, up to and including death, or upon the discretion of your surgeon and anesthesiologist your surgery may need to be rescheduled.     _X__ 1. Do not eat food after midnight the night before your procedure.                 No gum chewing or hard candies. You may drink clear liquids up to 2 hours                 before you are scheduled to arrive for your surgery- DO not drink clear                 liquids within 2 hours of the start of your surgery.                 Clear Liquids include:  water, apple juice without pulp, clear carbohydrate                 drink such as Clearfast or Gatorade, Black Coffee or Tea (Do not add                 anything to coffee or tea). Diabetics water only  Drink the Ensure pre surgery drink 2 hours prior to arrival to surgery  __X__2.  On the morning of surgery brush your teeth with toothpaste and water, you                 may rinse your mouth with mouthwash if you wish.  Do not swallow any              toothpaste of mouthwash.     _X__ 3.  No Alcohol for 24 hours before or after surgery.   _X__ 4.  Do Not Smoke or use e-cigarettes For 24 Hours Prior to Your Surgery.                 Do not use any chewable tobacco products for at least 6 hours prior to                 surgery.  ____  5.  Bring all medications with you on the day of surgery if instructed.   __X__  6.  Notify your doctor if there is any change in your medical condition      (cold, fever, infections).     Do not wear jewelry, make-up, hairpins, clips or nail polish. Do not wear lotions, powders, or perfumes.  Do not shave body hair 48 hours prior to surgery. Men may shave face and neck. Do not bring  valuables to the hospital.    [facility] is not responsible for any belongings or valuables.  Contacts, dentures/partials or body piercings may not be worn into surgery. Bring a case for your contacts, glasses or hearing aids, a denture cup will be supplied. Leave your suitcase in the car. After surgery it may be brought to your room. For patients admitted to the hospital, discharge time is determined by your treatment team.   Patients discharged the day of surgery will not be allowed to drive home.   Please read over the following fact sheets that you were given:   MRSA Information,  CHG soap, Incentive Spirometer, Ensure  __X__ Take these medicines the morning of surgery with A SIP OF WATER:    1. none  2.   3.   4.  5.  6.  ____ Fleet Enema (as directed)   __X__ Use CHG Soap/SAGE wipes as directed  ____ Use inhalers on the day of surgery  ____ Stop metformin/Janumet/Farxiga 2 days prior to surgery    ____ Take 1/2 of usual insulin dose the night before surgery. No insulin the morning          of surgery.   ____ Stop Blood Thinners Coumadin/Plavix/Xarelto/Pleta/Pradaxa/Eliquis/Effient/Aspirin  on   Or contact your Surgeon, Cardiologist or Medical Doctor regarding  ability to stop your blood thinners  __X__ Stop Anti-inflammatories 7 days before surgery such as Advil, Ibuprofen, Motrin,  BC or Goodies Powder, Naprosyn, Naproxen, Aleve, Aspirin   You may use Tylenol  __X__ Stop all herbals and supplements, fish oil or vitamins  until after surgery.    ____ Bring C-Pap to the hospital.

## 2021-09-08 ENCOUNTER — Ambulatory Visit: Admission: RE | Admit: 2021-09-08 | Payer: Medicare HMO | Source: Home / Self Care | Admitting: Podiatry

## 2021-09-08 ENCOUNTER — Encounter: Admission: RE | Payer: Self-pay | Source: Home / Self Care

## 2021-09-08 SURGERY — AMPUTATION, TOE
Anesthesia: Choice | Site: Toe | Laterality: Right

## 2021-09-08 MED ORDER — LACTATED RINGERS IV SOLN: INTRAVENOUS | Status: DC

## 2021-09-08 MED ORDER — CLINDAMYCIN PHOSPHATE 900 MG/50ML IV SOLN: 900.0000 mg | INTRAVENOUS | Status: DC

## 2021-09-08 MED ORDER — FAMOTIDINE 20 MG PO TABS: 20.0000 mg | ORAL_TABLET | Freq: Once | ORAL | Status: DC

## 2021-10-30 DIAGNOSIS — E7800 Pure hypercholesterolemia, unspecified: Secondary | ICD-10-CM | POA: Diagnosis not present

## 2021-10-30 DIAGNOSIS — Z79899 Other long term (current) drug therapy: Secondary | ICD-10-CM | POA: Diagnosis not present

## 2021-10-30 DIAGNOSIS — Z0000 Encounter for general adult medical examination without abnormal findings: Secondary | ICD-10-CM | POA: Diagnosis not present

## 2021-10-30 DIAGNOSIS — Z1331 Encounter for screening for depression: Secondary | ICD-10-CM | POA: Diagnosis not present

## 2021-10-30 DIAGNOSIS — R5383 Other fatigue: Secondary | ICD-10-CM | POA: Diagnosis not present

## 2021-10-30 DIAGNOSIS — Z1339 Encounter for screening examination for other mental health and behavioral disorders: Secondary | ICD-10-CM | POA: Diagnosis not present

## 2021-10-30 DIAGNOSIS — Z299 Encounter for prophylactic measures, unspecified: Secondary | ICD-10-CM | POA: Diagnosis not present

## 2021-10-30 DIAGNOSIS — Z7189 Other specified counseling: Secondary | ICD-10-CM | POA: Diagnosis not present

## 2021-10-30 DIAGNOSIS — Z789 Other specified health status: Secondary | ICD-10-CM | POA: Diagnosis not present

## 2021-11-21 DIAGNOSIS — M2042 Other hammer toe(s) (acquired), left foot: Secondary | ICD-10-CM | POA: Diagnosis not present

## 2021-11-21 DIAGNOSIS — M898X9 Other specified disorders of bone, unspecified site: Secondary | ICD-10-CM | POA: Diagnosis not present

## 2021-11-21 DIAGNOSIS — M2041 Other hammer toe(s) (acquired), right foot: Secondary | ICD-10-CM | POA: Diagnosis not present

## 2021-12-21 ENCOUNTER — Other Ambulatory Visit: Payer: Self-pay | Admitting: Internal Medicine

## 2021-12-21 DIAGNOSIS — Z1231 Encounter for screening mammogram for malignant neoplasm of breast: Secondary | ICD-10-CM

## 2021-12-25 ENCOUNTER — Ambulatory Visit
Admission: RE | Admit: 2021-12-25 | Discharge: 2021-12-25 | Disposition: A | Discharge status: Home / Self Care | Payer: Medicare HMO | Source: Ambulatory Visit | Attending: Internal Medicine | Admitting: Internal Medicine

## 2021-12-25 DIAGNOSIS — Z1231 Encounter for screening mammogram for malignant neoplasm of breast: Secondary | ICD-10-CM | POA: Insufficient documentation

## 2021-12-27 ENCOUNTER — Other Ambulatory Visit: Payer: Self-pay | Admitting: Internal Medicine

## 2021-12-27 DIAGNOSIS — N6489 Other specified disorders of breast: Secondary | ICD-10-CM

## 2021-12-27 DIAGNOSIS — R928 Other abnormal and inconclusive findings on diagnostic imaging of breast: Secondary | ICD-10-CM

## 2022-01-17 ENCOUNTER — Ambulatory Visit: Payer: Medicare HMO

## 2022-01-17 ENCOUNTER — Ambulatory Visit
Admission: RE | Admit: 2022-01-17 | Discharge: 2022-01-17 | Disposition: A | Discharge status: Home / Self Care | Payer: Medicare HMO | Source: Ambulatory Visit | Attending: Internal Medicine | Admitting: Internal Medicine

## 2022-01-17 DIAGNOSIS — N6489 Other specified disorders of breast: Secondary | ICD-10-CM | POA: Diagnosis not present

## 2022-01-17 DIAGNOSIS — R928 Other abnormal and inconclusive findings on diagnostic imaging of breast: Secondary | ICD-10-CM | POA: Diagnosis not present

## 2022-01-17 DIAGNOSIS — R922 Inconclusive mammogram: Secondary | ICD-10-CM | POA: Diagnosis not present

## 2022-04-03 DIAGNOSIS — K5900 Constipation, unspecified: Secondary | ICD-10-CM | POA: Diagnosis not present

## 2022-04-03 DIAGNOSIS — E782 Mixed hyperlipidemia: Secondary | ICD-10-CM | POA: Diagnosis not present

## 2022-04-03 DIAGNOSIS — Z1211 Encounter for screening for malignant neoplasm of colon: Secondary | ICD-10-CM | POA: Diagnosis not present

## 2022-04-03 DIAGNOSIS — M810 Age-related osteoporosis without current pathological fracture: Secondary | ICD-10-CM | POA: Diagnosis not present

## 2022-04-09 DIAGNOSIS — Z1211 Encounter for screening for malignant neoplasm of colon: Secondary | ICD-10-CM | POA: Diagnosis not present

## 2022-04-27 DIAGNOSIS — M898X9 Other specified disorders of bone, unspecified site: Secondary | ICD-10-CM | POA: Diagnosis not present

## 2022-04-27 DIAGNOSIS — M2041 Other hammer toe(s) (acquired), right foot: Secondary | ICD-10-CM | POA: Diagnosis not present

## 2022-04-27 DIAGNOSIS — M2042 Other hammer toe(s) (acquired), left foot: Secondary | ICD-10-CM | POA: Diagnosis not present

## 2022-05-06 DIAGNOSIS — S90851A Superficial foreign body, right foot, initial encounter: Secondary | ICD-10-CM | POA: Diagnosis not present

## 2022-05-06 DIAGNOSIS — L03012 Cellulitis of left finger: Secondary | ICD-10-CM | POA: Diagnosis not present

## 2022-09-10 DIAGNOSIS — M810 Age-related osteoporosis without current pathological fracture: Secondary | ICD-10-CM | POA: Diagnosis not present

## 2022-09-10 DIAGNOSIS — Z0001 Encounter for general adult medical examination with abnormal findings: Secondary | ICD-10-CM | POA: Diagnosis not present

## 2022-09-10 DIAGNOSIS — Z79899 Other long term (current) drug therapy: Secondary | ICD-10-CM | POA: Diagnosis not present

## 2022-09-10 DIAGNOSIS — M542 Cervicalgia: Secondary | ICD-10-CM | POA: Diagnosis not present

## 2022-09-10 DIAGNOSIS — E559 Vitamin D deficiency, unspecified: Secondary | ICD-10-CM | POA: Diagnosis not present

## 2022-09-11 DIAGNOSIS — H2513 Age-related nuclear cataract, bilateral: Secondary | ICD-10-CM | POA: Diagnosis not present

## 2022-09-11 DIAGNOSIS — Z0100 Encounter for examination of eyes and vision without abnormal findings: Secondary | ICD-10-CM | POA: Diagnosis not present

## 2022-09-13 ENCOUNTER — Ambulatory Visit: Payer: Medicare HMO | Attending: Family Medicine | Admitting: Physical Therapy

## 2022-09-13 DIAGNOSIS — R293 Abnormal posture: Secondary | ICD-10-CM | POA: Diagnosis not present

## 2022-09-13 DIAGNOSIS — M62838 Other muscle spasm: Secondary | ICD-10-CM | POA: Diagnosis not present

## 2022-09-13 DIAGNOSIS — M542 Cervicalgia: Secondary | ICD-10-CM | POA: Diagnosis not present

## 2022-09-13 NOTE — Therapy (Signed)
OUTPATIENT PHYSICAL THERAPY CERVICAL EVALUATION   Patient Name: Stacy Murphy MRN: [id number] DOB:[birth year], 72 y.o., female Today's Date: 09/14/2022  END OF SESSION:  PT End of Session - 09/14/22 1634     Visit Number 1    Date for PT Re-Evaluation 12/06/22    Authorization Type [employer]    PT Start Time 1402    PT Stop Time 1442    PT Time Calculation (min) 40 min    Activity Tolerance Patient tolerated treatment well    Behavior During Therapy WFL for tasks assessed/performed             Past Medical History:  Diagnosis Date   Erosive oral lichen planus    Osteoporosis    Past Surgical History:  Procedure Laterality Date   TUBAL LIGATION     Patient Active Problem List   Diagnosis Date Noted   TIA (transient ischemic attack) 06/12/2016    PCP: Glenda Chroman, MD  REFERRING PROVIDER: Lujean Amel, MD  REFERRING DIAG: M54.2 (ICD-10-CM) - Neck pain  THERAPY DIAG:  Cervicalgia  Abnormal posture  Other muscle spasm  Rationale for Evaluation and Treatment: Rehabilitation  ONSET DATE: 1-2 months  SUBJECTIVE:                                                                                                                                                                                                         SUBJECTIVE STATEMENT: Neck was hurting more a week ago.  Pt reports she has been moving it and aleve helps. Turning head is the worst.  And sometimes has lateral shoulder pain or can go all the way down the arm  PERTINENT HISTORY:  osteoarthritis  PAIN:  PAIN:  Are you having pain? Yes NPRS scale: 5-6/10 at worst Pain location: neck Pain orientation: Bilateral  PAIN TYPE: aching Pain description: intermittent  Aggravating factors: turning head and some looking up and down Relieving factors: aleve    PRECAUTIONS: None  WEIGHT BEARING RESTRICTIONS: No  FALLS:  Has patient fallen in last 6 months? No  LIVING ENVIRONMENT: Lives with:  lives with their family and lives with their spouse Lives in: House/apartment   OCCUPATION: NA  PLOF: Independent  PATIENT GOALS: get rid of pain  NEXT MD VISIT:   OBJECTIVE:   PATIENT SURVEYS:  FOTO 34  COGNITION: Overall cognitive status: Within functional limits for tasks assessed  SENSATION: Appears normal - no numbness  POSTURE: rounded shoulders, forward head, increased thoracic kyphosis, and mid-thoracic rotation to the right  PALPATION: Tight upper traps and  paraspinals tight and mildly tender to palpation   CERVICAL ROM: pain only with rotation, little bit with lateral flexion (more of a stretch)  Active ROM A/PROM (deg) eval  Flexion 42  Extension 30  Right lateral flexion 25  Left lateral flexion 28  Right rotation 38  Left rotation 52   (Blank rows = not tested)  UPPER EXTREMITY ROM: full shoulder ROM  UPPER EXTREMITY MMT: shoulder MMT 4/5 bilateral; cervical MMT 5/5 all directions and pain free   CERVICAL SPECIAL TESTS:  Spurling's test: Negative   TODAY'S TREATMENT:                                                                                                                              DATE: 09/13/22   PATIENT EDUCATION:  Education details: Access Code: [email or web address] Person educated: Patient Education method: Explanation, Demonstration, Corporate treasurer cues, Verbal cues, and Handouts Education comprehension: verbalized understanding and returned demonstration  HOME EXERCISE PROGRAM: Access Code: [email or web address] URL: [email or web address] Date: 09/14/2022 Prepared by: Jari Favre  Exercises - Seated Passive Cervical Retraction  - 1 x daily - 7 x weekly - 3 sets - 10 reps - Supine Cervical Retraction with Towel  - 1 x daily - 7 x weekly - 3 sets - 10 reps - Supine Scapular Retraction  - 1 x daily - 7 x weekly - 3 sets - 10 reps - Seated Scapular Retraction  - 1 x daily - 7 x weekly - 3 sets - 10 reps - Seated Gentle Upper Trapezius  Stretch  - 1 x daily - 7 x weekly - 1 sets - 3 reps - 30 sec hold  Patient Education - Trigger Point Dry Needling  ASSESSMENT:  CLINICAL IMPRESSION: Patient is a 72 y.o. female who was seen today for physical therapy evaluation and treatment for cervicalgia.  Pt has posture with forward head and limited thoracic mobility, rounded shoulders.  Pt has 4/5 MMT of bilateral shoulders.  Limited cervical ROM as mentioned above with pain when rotating.  Pt has very tight upper traps and cervical paraspinals.  Pt has negative Spurling test.  Pt was given initial HEP. She will benefit from skilled PT to address improved ROM and pain management and any other impairments listed above to return to full activities without pain.  OBJECTIVE IMPAIRMENTS: decreased ROM, decreased strength, increased muscle spasms, postural dysfunction, and pain.   ACTIVITY LIMITATIONS:  looking up to reach overhead  PARTICIPATION LIMITATIONS: community activity and driving (helping husband look)  PERSONAL FACTORS: 1 comorbidity: OA  are also affecting patient's functional outcome.   REHAB POTENTIAL: Excellent  CLINICAL DECISION MAKING: Stable/uncomplicated  EVALUATION COMPLEXITY: Low   GOALS: Goals reviewed with patient? Yes  SHORT TERM GOALS: Target date: 12/06/22  Ind with intial HEP Baseline:  Goal status: INITIAL  2.  Pain 25%less Baseline:  Goal status: INITIAL  3.  Pt will demonstrate 50 deg rotation bil without pain Baseline:  Goal status: INITIAL  LONG TERM GOALS: Target date: 6/6//24  Pt will be independent with advanced HEP to maintain improvements made throughout therapy  Baseline:  Goal status: INITIAL  2.  Pt will report at least 75% less pain Baseline:  Goal status: INITIAL  3.  Pt will be able to do cervical rotation of at least 65 deg bilaterally for improved looking during functional activities Baseline:  Goal status: INITIAL  4.  Pt will be able to look up during functional  overhead activities without pain due to improved posture and mobility Baseline:  Goal status: INITIAL     PLAN:  PT FREQUENCY: 1x/week  PT DURATION: 12 weeks  PLANNED INTERVENTIONS: Therapeutic exercises, Therapeutic activity, Neuromuscular re-education, Balance training, Gait training, Patient/Family education, Self Care, Joint mobilization, Dry Needling, Electrical stimulation, Cryotherapy, Moist heat, Traction, Biofeedback, Manual therapy, and Re-evaluation  PLAN FOR NEXT SESSION: discussed dry needling and traction and pt okay with both, work on cervical and thoracic ROM and posture   [employer], PT 09/14/2022, 4:36 PM

## 2022-09-14 ENCOUNTER — Other Ambulatory Visit: Payer: Self-pay

## 2022-09-14 ENCOUNTER — Encounter: Payer: Self-pay | Admitting: Physical Therapy

## 2022-10-04 ENCOUNTER — Ambulatory Visit: Payer: HMO | Attending: Family Medicine | Admitting: Physical Therapy

## 2022-10-04 DIAGNOSIS — M542 Cervicalgia: Secondary | ICD-10-CM | POA: Diagnosis not present

## 2022-10-04 DIAGNOSIS — R293 Abnormal posture: Secondary | ICD-10-CM | POA: Diagnosis not present

## 2022-10-04 DIAGNOSIS — M62838 Other muscle spasm: Secondary | ICD-10-CM | POA: Diagnosis not present

## 2022-10-04 NOTE — Therapy (Signed)
OUTPATIENT PHYSICAL THERAPY CERVICAL PROGRESS NOTE   Patient Name: Stacy Murphy MRN: [id number] DOB:May 08, 1951, 72 y.o., female Today's Date: 10/04/2022  END OF SESSION:  PT End of Session - 10/04/22 1358     Visit Number 2    Number of Visits --    Date for PT Re-Evaluation 12/06/22    Authorization Type [employer]    PT Start Time 1400    PT Stop Time 1440    PT Time Calculation (min) 40 min    Activity Tolerance Patient tolerated treatment well             Past Medical History:  Diagnosis Date   Erosive oral lichen planus    Osteoporosis    Past Surgical History:  Procedure Laterality Date   TUBAL LIGATION     Patient Active Problem List   Diagnosis Date Noted   TIA (transient ischemic attack) 06/12/2016    PCP: Glenda Chroman, MD  REFERRING PROVIDER: Lujean Amel, MD  REFERRING DIAG: M54.2 (ICD-10-CM) - Neck pain  THERAPY DIAG:  Cervicalgia  Abnormal posture  Other muscle spasm  Rationale for Evaluation and Treatment: Rehabilitation  ONSET DATE: 1-2 months  SUBJECTIVE:                                                                                                                                                                                                         SUBJECTIVE STATEMENT: Pain with turning head or looking up or down. Right UE pain and hand numbness 1-2x/week.   I lost my exercise paper.   I want to try other therapy first before DN.  Sore in right arm (deltoid) from shot yesterday.   PERTINENT HISTORY:  osteoarthritis  PAIN:  PAIN:  Are you having pain? Yes NPRS scale: 5/10  Pain location: neck Pain orientation: Bilateral  PAIN TYPE: aching Pain description: intermittent  Aggravating factors: turning head and some looking up and down Relieving factors: aleve    PRECAUTIONS: None  WEIGHT BEARING RESTRICTIONS: No  FALLS:  Has patient fallen in last 6 months? No  LIVING ENVIRONMENT: Lives with: lives  with their family and lives with their spouse Lives in: House/apartment   OCCUPATION: NA  PLOF: Independent  PATIENT GOALS: get rid of pain  NEXT MD VISIT:   OBJECTIVE:   PATIENT SURVEYS:  FOTO 85  COGNITION: Overall cognitive status: Within functional limits for tasks assessed  SENSATION: Appears normal - no numbness  POSTURE: rounded shoulders, forward head, increased thoracic kyphosis, and mid-thoracic rotation to the right  PALPATION: Tight  upper traps and paraspinals tight and mildly tender to palpation   CERVICAL ROM: pain only with rotation, little bit with lateral flexion (more of a stretch)  Active ROM A/PROM (deg) eval  Flexion 42  Extension 30  Right lateral flexion 25  Left lateral flexion 28  Right rotation 38  Left rotation 52   (Blank rows = not tested)  UPPER EXTREMITY ROM: full shoulder ROM  UPPER EXTREMITY MMT: shoulder MMT 4/5 bilateral; cervical MMT 5/5 all directions and pain free   CERVICAL SPECIAL TESTS:  Spurling's test: Negative   TODAY'S TREATMENT:                                                                                                                              DATE:  10/04/22: Manual therapy:  cervical distraction 2x10, 10 sec holds, suboccipital release; upper trap and levator stretching with contract/relax; rotation mobs with movement supine; right UE neural mobilization 15x Supine with neck on foam roll: rotation, circles, nods 8x each Sidelying open books right 10x Seated upper trap stretch 3x 20 sec holds Seated scapular retraction  Seated cervical rotation mob with towel assist 10x right/left Seated thoracic extension with ball and towel roll 2x 10 (cues to avoid neck extension, use hands to support neck)   PATIENT EDUCATION:  Education details: Access Code: [email or web address] Person educated: Patient Education method: Explanation, Demonstration, Corporate treasurer cues, Verbal cues, and Handouts Education comprehension: verbalized  understanding and returned demonstration  HOME EXERCISE PROGRAM: Access Code: [email or web address] URL: [email or web address] Date: 10/04/2022 Prepared by: Ruben Im  Exercises - Seated Passive Cervical Retraction  - 1 x daily - 7 x weekly - 3 sets - 10 reps - Supine Cervical Retraction with Towel  - 1 x daily - 7 x weekly - 3 sets - 10 reps - Supine Scapular Retraction  - 1 x daily - 7 x weekly - 3 sets - 10 reps - Seated Scapular Retraction  - 1 x daily - 7 x weekly - 3 sets - 10 reps - Seated Gentle Upper Trapezius Stretch  - 1 x daily - 7 x weekly - 1 sets - 3 reps - 30 sec hold - Sidelying Open Book Thoracic Lumbar Rotation and Extension  - 1 x daily - 7 x weekly - 1 sets - 10 reps - Seated Assisted Cervical Rotation with Towel  - 1 x daily - 7 x weekly - 1 sets - 10 reps - Seated Thoracic Lumbar Extension with Pectoralis Stretch  - 1 x daily - 7 x weekly - 1 sets - 10 reps  Patient Education - Trigger Point Dry Needling   Patient Education - Trigger Point Dry Needling  ASSESSMENT:  CLINICAL IMPRESSION: Less pain with supine rotation and towel assisted rotation ROM.  Verbal and tactile cues to avoid compensatory strategies with exercises.  No UE symptoms today even with neural mobilization.  Therapist updating and progressing HEP.    OBJECTIVE IMPAIRMENTS: decreased ROM, decreased  strength, increased muscle spasms, postural dysfunction, and pain.   ACTIVITY LIMITATIONS:  looking up to reach overhead  PARTICIPATION LIMITATIONS: community activity and driving (helping husband look)  PERSONAL FACTORS: 1 comorbidity: OA  are also affecting patient's functional outcome.   REHAB POTENTIAL: Excellent  CLINICAL DECISION MAKING: Stable/uncomplicated  EVALUATION COMPLEXITY: Low   GOALS: Goals reviewed with patient? Yes  SHORT TERM GOALS: Target date: 12/06/22  Ind with intial HEP Baseline:  Goal status: INITIAL  2.  Pain 25%less Baseline:  Goal status:  INITIAL  3.  Pt will demonstrate 50 deg rotation bil without pain Baseline:  Goal status: INITIAL    LONG TERM GOALS: Target date: 6/6//24  Pt will be independent with advanced HEP to maintain improvements made throughout therapy  Baseline:  Goal status: INITIAL  2.  Pt will report at least 75% less pain Baseline:  Goal status: INITIAL  3.  Pt will be able to do cervical rotation of at least 65 deg bilaterally for improved looking during functional activities Baseline:  Goal status: INITIAL  4.  Pt will be able to look up during functional overhead activities without pain due to improved posture and mobility Baseline:  Goal status: INITIAL     PLAN:  PT FREQUENCY: 1x/week  PT DURATION: 12 weeks  PLANNED INTERVENTIONS: Therapeutic exercises, Therapeutic activity, Neuromuscular re-education, Balance training, Gait training, Patient/Family education, Self Care, Joint mobilization, Dry Needling, Electrical stimulation, Cryotherapy, Moist heat, Traction, Biofeedback, Manual therapy, and Re-evaluation  PLAN FOR NEXT SESSION: cervical and thoracic ROM and posture; hold on DN per patient request; try band ex's  Ruben Im, PT 10/04/22 2:48 PM Phone: [phone] Fax: [phone]

## 2022-10-09 ENCOUNTER — Ambulatory Visit: Payer: HMO | Admitting: Physical Therapy

## 2022-10-09 DIAGNOSIS — R293 Abnormal posture: Secondary | ICD-10-CM

## 2022-10-09 DIAGNOSIS — M542 Cervicalgia: Secondary | ICD-10-CM

## 2022-10-09 DIAGNOSIS — M62838 Other muscle spasm: Secondary | ICD-10-CM

## 2022-10-09 NOTE — Therapy (Signed)
OUTPATIENT PHYSICAL THERAPY CERVICAL PROGRESS NOTE   Patient Name: Stacy Murphy MRN: [id number] DOB:[birth year], 72 y.o., female Today's Date: 10/09/2022  END OF SESSION:  PT End of Session - 10/09/22 1443     Visit Number 3    Date for PT Re-Evaluation 12/06/22    Authorization Type [employer]    PT Start Time 1445    PT Stop Time 1525    PT Time Calculation (min) 40 min    Activity Tolerance Patient tolerated treatment well             Past Medical History:  Diagnosis Date   Erosive oral lichen planus    Osteoporosis    Past Surgical History:  Procedure Laterality Date   TUBAL LIGATION     Patient Active Problem List   Diagnosis Date Noted   TIA (transient ischemic attack) 06/12/2016    PCP: Ignatius SpeckingVyas, Dhruv B, MD  REFERRING PROVIDER: Darrow BussingKoirala, Dibas, MD  REFERRING DIAG: M54.2 (ICD-10-CM) - Neck pain  THERAPY DIAG:  Cervicalgia  Abnormal posture  Other muscle spasm  Rationale for Evaluation and Treatment: Rehabilitation  ONSET DATE: 1-2 months  SUBJECTIVE:                                                                                                                                                                                                         SUBJECTIVE STATEMENT:  Feeling good.  No hand numbness.  Doing better with turning head and looking up and down.  The towel ex really helped     PERTINENT HISTORY:  osteoarthritis  PAIN:  PAIN:  Are you having pain? Yes NPRS scale: 2-3/10  Pain location: neck Pain orientation: Bilateral  PAIN TYPE: aching Pain description: intermittent  Aggravating factors: turning head and some looking up and down Relieving factors: aleve    PRECAUTIONS: None  WEIGHT BEARING RESTRICTIONS: No  FALLS:  Has patient fallen in last 6 months? No  LIVING ENVIRONMENT: Lives with: lives with their family and lives with their spouse Lives in: House/apartment   OCCUPATION: NA  PLOF:  Independent  PATIENT GOALS: get rid of pain  NEXT MD VISIT:   OBJECTIVE:   PATIENT SURVEYS:  [id number] 58  COGNITION: Overall cognitive status: Within functional limits for tasks assessed  SENSATION: Appears normal - no numbness  POSTURE: rounded shoulders, forward head, increased thoracic kyphosis, and mid-thoracic rotation to the right  PALPATION: Tight upper traps and paraspinals tight and mildly tender to palpation   CERVICAL ROM: pain only with rotation, little bit with lateral flexion (more  of a stretch)  Active ROM A/PROM (deg) eval  Flexion 42  Extension 30  Right lateral flexion 25  Left lateral flexion 28  Right rotation 38  Left rotation 52   (Blank rows = not tested)  UPPER EXTREMITY ROM: full shoulder ROM  UPPER EXTREMITY MMT: shoulder MMT 4/5 bilateral; cervical MMT 5/5 all directions and pain free   CERVICAL SPECIAL TESTS:  Spurling's test: Negative   TODAY'S TREATMENT:                                                                                                                              DATE:  10/09/22: Manual therapy:  cervical distraction 2x10, 10 sec holds, suboccipital release; upper trap and levator stretching with contract/relax; rotation mobs with movement supine Supine cervical retractions 5x Supine shoulder press down 5x Supine UE elevation 5x with coordinated breathing Supine yellow band: horizontal abduction, diagonals, external rotation 5-8x each Standing green band: rows, shoulder extensions, triceps extensions 10x each Seated cervical rotation mob with towel assist 10x right/left Seated thoracic extension with ball and towel roll 2x 10 (cues to avoid neck extension, use hands to support neck)    10/04/22: Manual therapy:  cervical distraction 2x10, 10 sec holds, suboccipital release; upper trap and levator stretching with contract/relax; rotation mobs with movement supine; right UE neural mobilization 15x Supine with neck on foam  roll: rotation, circles, nods 8x each Sidelying open books right 10x Seated upper trap stretch 3x 20 sec holds Seated scapular retraction  Seated cervical rotation mob with towel assist 10x right/left Seated thoracic extension with ball and towel roll 2x 10 (cues to avoid neck extension, use hands to support neck)   PATIENT EDUCATION:  Education details: Access Code: [email or web address] Person educated: Patient Education method: Explanation, Demonstration, Actor cues, Verbal cues, and Handouts Education comprehension: verbalized understanding and returned demonstration  HOME EXERCISE PROGRAM: Access Code: [email or web address] URL: [email or web address] Date: 10/09/2022 Prepared by: Lavinia Sharps  Exercises - Seated Passive Cervical Retraction  - 1 x daily - 7 x weekly - 3 sets - 10 reps - Supine Cervical Retraction with Towel  - 1 x daily - 7 x weekly - 3 sets - 10 reps - Supine Scapular Retraction  - 1 x daily - 7 x weekly - 3 sets - 10 reps - Seated Scapular Retraction  - 1 x daily - 7 x weekly - 3 sets - 10 reps - Seated Gentle Upper Trapezius Stretch  - 1 x daily - 7 x weekly - 1 sets - 3 reps - 30 sec hold - Sidelying Open Book Thoracic Lumbar Rotation and Extension  - 1 x daily - 7 x weekly - 1 sets - 10 reps - Seated Assisted Cervical Rotation with Towel  - 1 x daily - 7 x weekly - 1 sets - 10 reps - Seated Thoracic Lumbar Extension with Pectoralis Stretch  - 1 x daily - 7 x weekly - 1 sets -  10 reps - Supine Shoulder Horizontal Abduction with Resistance  - 1 x daily - 7 x weekly - 1 sets - 10 reps - Supine Shoulder External Rotation with Resistance  - 1 x daily - 7 x weekly - 1 sets - 10 reps - Standing Row with Anchored Resistance  - 1 x daily - 7 x weekly - 1 sets - 10 reps - Shoulder extension with resistance - Neutral  - 1 x daily - 7 x weekly - 1 sets - 10 reps - Standing Elbow Extension with Anchored Resistance  - 1 x daily - 7 x weekly - 1 sets - 10 reps - Seated Elbow  Extension with Self-Anchored Resistance  - 1 x daily - 7 x weekly - 1 sets - 10 reps  Patient Education - Trigger Point Dry Needling  ASSESSMENT:  CLINICAL IMPRESSION: Decreased peripheral symptoms noted and improved cervical rotation ROM.  Able to progress HEP to included upper quarter resistance with a good initial response.  Verbal and tactile cues to avoid compensatory strategies with some exercises.       OBJECTIVE IMPAIRMENTS: decreased ROM, decreased strength, increased muscle spasms, postural dysfunction, and pain.   ACTIVITY LIMITATIONS:  looking up to reach overhead  PARTICIPATION LIMITATIONS: community activity and driving (helping husband look)  PERSONAL FACTORS: 1 comorbidity: OA  are also affecting patient's functional outcome.   REHAB POTENTIAL: Excellent  CLINICAL DECISION MAKING: Stable/uncomplicated  EVALUATION COMPLEXITY: Low   GOALS: Goals reviewed with patient? Yes  SHORT TERM GOALS: Target date: 11/05/22  Ind with intial HEP Baseline:  Goal status: INITIAL  2.  Pain 25%less Baseline:  Goal status: INITIAL  3.  Pt will demonstrate 50 deg rotation bil without pain Baseline:  Goal status: INITIAL    LONG TERM GOALS: Target date: 6/6//24  Pt will be independent with advanced HEP to maintain improvements made throughout therapy  Baseline:  Goal status: INITIAL  2.  Pt will report at least 75% less pain Baseline:  Goal status: INITIAL  3.  Pt will be able to do cervical rotation of at least 65 deg bilaterally for improved looking during functional activities Baseline:  Goal status: INITIAL  4.  Pt will be able to look up during functional overhead activities without pain due to improved posture and mobility Baseline:  Goal status: INITIAL     PLAN:  PT FREQUENCY: 1x/week  PT DURATION: 12 weeks  PLANNED INTERVENTIONS: Therapeutic exercises, Therapeutic activity, Neuromuscular re-education, Balance training, Gait training,  Patient/Family education, Self Care, Joint mobilization, Dry Needling, Electrical stimulation, Cryotherapy, Moist heat, Traction, Biofeedback, Manual therapy, and Re-evaluation  PLAN FOR NEXT SESSION: recheck ROM;  cervical and thoracic ROM and posture; hold on DN per patient request; review band ex's; try counter push ups; try band row and rotate;  try open books with band  Lavinia Sharps, PT 10/09/22 5:10 PM Phone: [phone] Fax: [phone]

## 2022-10-18 DIAGNOSIS — M2042 Other hammer toe(s) (acquired), left foot: Secondary | ICD-10-CM | POA: Diagnosis not present

## 2022-10-18 DIAGNOSIS — M2041 Other hammer toe(s) (acquired), right foot: Secondary | ICD-10-CM | POA: Diagnosis not present

## 2022-10-18 DIAGNOSIS — M898X9 Other specified disorders of bone, unspecified site: Secondary | ICD-10-CM | POA: Diagnosis not present

## 2022-10-19 ENCOUNTER — Ambulatory Visit: Payer: HMO | Admitting: Physical Therapy

## 2022-10-19 DIAGNOSIS — R293 Abnormal posture: Secondary | ICD-10-CM

## 2022-10-19 DIAGNOSIS — M62838 Other muscle spasm: Secondary | ICD-10-CM

## 2022-10-19 DIAGNOSIS — M542 Cervicalgia: Secondary | ICD-10-CM | POA: Diagnosis not present

## 2022-10-19 NOTE — Therapy (Signed)
OUTPATIENT PHYSICAL THERAPY CERVICAL PROGRESS NOTE   Patient Name: Stacy Murphy MRN: [id number] DOB:Oct 20, 1950, 72 y.o., female Today's Date: 10/19/2022  END OF SESSION:  PT End of Session - 10/19/22 1102     Visit Number 4    Date for PT Re-Evaluation 12/06/22    Authorization Type [employer]    PT Start Time 1101    PT Stop Time 1140    PT Time Calculation (min) 39 min    Activity Tolerance Patient tolerated treatment well             Past Medical History:  Diagnosis Date   Erosive oral lichen planus    Osteoporosis    Past Surgical History:  Procedure Laterality Date   TUBAL LIGATION     Patient Active Problem List   Diagnosis Date Noted   TIA (transient ischemic attack) 06/12/2016    PCP: Ignatius Specking, MD  REFERRING PROVIDER: Darrow Bussing, MD  REFERRING DIAG: M54.2 (ICD-10-CM) - Neck pain  THERAPY DIAG:  Cervicalgia  Abnormal posture  Other muscle spasm  Rationale for Evaluation and Treatment: Rehabilitation  ONSET DATE: 1-2 months  SUBJECTIVE:                                                                                                                                                                                                         SUBJECTIVE STATEMENT:  Feeling much better. No pain or numbness in UE.  Doing ex's 1x/day    PERTINENT HISTORY:  osteoarthritis  PAIN:  PAIN:  Are you having pain? Yes NPRS scale: 2/10  Pain location: neck Pain orientation: Bilateral  PAIN TYPE: aching Pain description: intermittent  Aggravating factors: turning head and some looking up and down Relieving factors: aleve    PRECAUTIONS: None  WEIGHT BEARING RESTRICTIONS: No  FALLS:  Has patient fallen in last 6 months? No  LIVING ENVIRONMENT: Lives with: lives with their family and lives with their spouse Lives in: House/apartment   OCCUPATION: NA  PLOF: Independent  PATIENT GOALS: get rid of pain  NEXT MD VISIT:    OBJECTIVE:   PATIENT SURVEYS:  FOTO 49  COGNITION: Overall cognitive status: Within functional limits for tasks assessed  SENSATION: Appears normal - no numbness  POSTURE: rounded shoulders, forward head, increased thoracic kyphosis, and mid-thoracic rotation to the right  PALPATION: Tight upper traps and paraspinals tight and mildly tender to palpation   CERVICAL ROM: pain only with rotation, little bit with lateral flexion (more of a stretch)  Active ROM A/PROM (deg) eval 4/19  Flexion 42 50  Extension 30 35  Right lateral flexion 25 35  Left lateral flexion 28 37  Right rotation 38 47 mild pain  Left rotation 52 45    (Blank rows = not tested)  UPPER EXTREMITY ROM: full shoulder ROM  UPPER EXTREMITY MMT: shoulder MMT 4/5 bilateral; cervical MMT 5/5 all directions and pain free   CERVICAL SPECIAL TESTS:  Spurling's test: Negative   TODAY'S TREATMENT:                                                                                                                              DATE:  10/09/22: Manual therapy:  cervical distraction 2x10, 10 sec holds, suboccipital release; upper trap and levator stretching with contract/relax; rotation mobs with movement supine Counter push ups Row and rotate green band 10x right/left 1# front shoulder raises 1# lateral shoulder raises Supine cervical retractions 5x Supine shoulder press down 5x Supine UE elevation 5x with coordinated breathing Supine yellow band: horizontal abduction, diagonals, external rotation 5-8x each Standing green band: rows, shoulder extensions, triceps extensions 10x each Seated cervical rotation mob with towel assist 10x right/left Seated thoracic extension with ball and towel roll 2x 10 (cues to avoid neck extension, use hands to support neck)    10/04/22: Manual therapy:  cervical distraction 2x10, 10 sec holds, suboccipital release; upper trap and levator stretching with contract/relax; rotation mobs  with movement supine; right UE neural mobilization 15x Supine with neck on foam roll: rotation, circles, nods 8x each Sidelying open books right 10x Seated upper trap stretch 3x 20 sec holds Seated scapular retraction  Seated cervical rotation mob with towel assist 10x right/left Seated thoracic extension with ball and towel roll 2x 10 (cues to avoid neck extension, use hands to support neck)   PATIENT EDUCATION:  Education details: Access Code: [email or web address] Person educated: Patient Education method: Explanation, Demonstration, Actor cues, Verbal cues, and Handouts Education comprehension: verbalized understanding and returned demonstration  HOME EXERCISE PROGRAM: Access Code: [email or web address] URL: [email or web address] Date: 10/19/2022 Prepared by: Lavinia Sharps  Exercises - Seated Passive Cervical Retraction  - 1 x daily - 7 x weekly - 3 sets - 10 reps - Supine Cervical Retraction with Towel  - 1 x daily - 7 x weekly - 3 sets - 10 reps - Supine Scapular Retraction  - 1 x daily - 7 x weekly - 3 sets - 10 reps - Seated Scapular Retraction  - 1 x daily - 7 x weekly - 3 sets - 10 reps - Seated Gentle Upper Trapezius Stretch  - 1 x daily - 7 x weekly - 1 sets - 3 reps - 30 sec hold - Sidelying Open Book Thoracic Lumbar Rotation and Extension  - 1 x daily - 7 x weekly - 1 sets - 10 reps - Seated Assisted Cervical Rotation with Towel  - 1 x daily - 7 x weekly - 1 sets - 10 reps - Seated Thoracic Lumbar  Extension with Pectoralis Stretch  - 1 x daily - 7 x weekly - 1 sets - 10 reps - Supine Shoulder Horizontal Abduction with Resistance  - 1 x daily - 7 x weekly - 1 sets - 10 reps - Supine Shoulder External Rotation with Resistance  - 1 x daily - 7 x weekly - 1 sets - 10 reps - Standing Row with Anchored Resistance  - 1 x daily - 7 x weekly - 1 sets - 10 reps - Shoulder extension with resistance - Neutral  - 1 x daily - 7 x weekly - 1 sets - 10 reps - Standing Elbow Extension with  Anchored Resistance  - 1 x daily - 7 x weekly - 1 sets - 10 reps - Seated Elbow Extension with Self-Anchored Resistance  - 1 x daily - 7 x weekly - 1 sets - 10 reps - Push-Up on Counter  - 1 x daily - 7 x weekly - 1 sets - 10 reps - Standing Shoulder Abduction with Dumbbell  - 1 x daily - 7 x weekly - 1 sets - 10 reps  Patient Education - Trigger Point Dry Needling Access Code: [email or web address] URL: [email or web address] Date: 04/09/ASSESSMENT:  CLINICAL IMPRESSION: Good improvements in cervical ROM in most planes.  Pain level continues to decreased and no right UE symptoms in the past 2 weeks.  Verbal cues for technique with ex's to optimize ex effectiveness.  Updated and progressed HEP to include additional resistive ex's.  Majority of STGs met.       OBJECTIVE IMPAIRMENTS: decreased ROM, decreased strength, increased muscle spasms, postural dysfunction, and pain.   ACTIVITY LIMITATIONS:  looking up to reach overhead  PARTICIPATION LIMITATIONS: community activity and driving (helping husband look)  PERSONAL FACTORS: 1 comorbidity: OA  are also affecting patient's functional outcome.   REHAB POTENTIAL: Excellent  CLINICAL DECISION MAKING: Stable/uncomplicated  EVALUATION COMPLEXITY: Low   GOALS: Goals reviewed with patient? Yes  SHORT TERM GOALS: Target date: 11/05/22  Ind with intial HEP Baseline:  Goal status: met 4/19 2.  Pain 25%less Baseline:  Goal status: met 4/19  3.  Pt will demonstrate 50 deg rotation bil without pain Baseline:  Goal status: ongoing    LONG TERM GOALS: Target date: 6/6//24  Pt will be independent with advanced HEP to maintain improvements made throughout therapy  Baseline:  Goal status: INITIAL  2.  Pt will report at least 75% less pain Baseline:  Goal status: INITIAL  3.  Pt will be able to do cervical rotation of at least 65 deg bilaterally for improved looking during functional activities Baseline:  Goal status:  INITIAL  4.  Pt will be able to look up during functional overhead activities without pain due to improved posture and mobility Baseline:  Goal status: INITIAL     PLAN:  PT FREQUENCY: 1x/week  PT DURATION: 12 weeks  PLANNED INTERVENTIONS: Therapeutic exercises, Therapeutic activity, Neuromuscular re-education, Balance training, Gait training, Patient/Family education, Self Care, Joint mobilization, Dry Needling, Electrical stimulation, Cryotherapy, Moist heat, Traction, Biofeedback, Manual therapy, and Re-evaluation  PLAN FOR NEXT SESSION: 2 more visits then [place]; cervical and thoracic ROM and posture; hold on DN per patient request; review band ex's; try counter push ups;   try open books with band  Lavinia Sharps, PT 10/19/22 11:57 AM Phone: [phone] Fax: [phone]

## 2022-10-24 ENCOUNTER — Ambulatory Visit: Payer: HMO | Admitting: Physical Therapy

## 2022-10-24 DIAGNOSIS — M542 Cervicalgia: Secondary | ICD-10-CM

## 2022-10-24 DIAGNOSIS — M62838 Other muscle spasm: Secondary | ICD-10-CM

## 2022-10-24 DIAGNOSIS — R293 Abnormal posture: Secondary | ICD-10-CM

## 2022-10-24 NOTE — Therapy (Signed)
OUTPATIENT PHYSICAL THERAPY CERVICAL PROGRESS NOTE   Patient Name: Stacy Murphy MRN: [id number] DOB:12-10-1950, 72 y.o., female Today's Date: 10/24/2022  END OF SESSION:  PT End of Session - 10/24/22 1444     Visit Number 5    Date for PT Re-Evaluation 12/06/22    Authorization Type [employer]    PT Start Time 1445    PT Stop Time 1527    PT Time Calculation (min) 42 min    Activity Tolerance Patient tolerated treatment well             Past Medical History:  Diagnosis Date   Erosive oral lichen planus    Osteoporosis    Past Surgical History:  Procedure Laterality Date   TUBAL LIGATION     Patient Active Problem List   Diagnosis Date Noted   TIA (transient ischemic attack) 06/12/2016    PCP: Ignatius Specking, MD  REFERRING PROVIDER: Darrow Bussing, MD  REFERRING DIAG: M54.2 (ICD-10-CM) - Neck pain  THERAPY DIAG:  Cervicalgia  Abnormal posture  Other muscle spasm  Rationale for Evaluation and Treatment: Rehabilitation  ONSET DATE: 1-2 months  SUBJECTIVE:                                                                                                                                                                                                         SUBJECTIVE STATEMENT: Neck is doing better and no UE symptoms.     PERTINENT HISTORY:  osteoarthritis  PAIN:  PAIN:  Are you having pain? Yes NPRS scale: 2/10  Pain location: neck Pain orientation: Bilateral  right > left PAIN TYPE: aching Pain description: intermittent  Aggravating factors: turning head and some looking up and down Relieving factors: aleve    PRECAUTIONS: None  WEIGHT BEARING RESTRICTIONS: No  FALLS:  Has patient fallen in last 6 months? No  LIVING ENVIRONMENT: Lives with: lives with their family and lives with their spouse Lives in: House/apartment   OCCUPATION: NA  PLOF: Independent  PATIENT GOALS: get rid of pain  NEXT MD VISIT:   OBJECTIVE:    PATIENT SURVEYS:  FOTO 37  COGNITION: Overall cognitive status: Within functional limits for tasks assessed  SENSATION: Appears normal - no numbness  POSTURE: rounded shoulders, forward head, increased thoracic kyphosis, and mid-thoracic rotation to the right  PALPATION: Tight upper traps and paraspinals tight and mildly tender to palpation   CERVICAL ROM: pain only with rotation, little bit with lateral flexion (more of a stretch)  Active ROM A/PROM (deg) eval 4/19  Flexion 42  50  Extension 30 35  Right lateral flexion 25 35  Left lateral flexion 28 37  Right rotation 38 47 mild pain  Left rotation 52 45    (Blank rows = not tested)  UPPER EXTREMITY ROM: full shoulder ROM  UPPER EXTREMITY MMT: shoulder MMT 4/5 bilateral; cervical MMT 5/5 all directions and pain free   CERVICAL SPECIAL TESTS:  Spurling's test: Negative   TODAY'S TREATMENT:                                                                                                                              DATE:  4/24: Manual therapy:  cervical distraction 2x10, 10 sec holds, suboccipital release; upper trap and levator stretching with contract/relax; rotation mobs with movement supine Seated Foam roll thoracic extension 10x Seated Foam roll thoracic rotation 10x Counter push ups 10x UE wall slides elevation with lift off at the top 8x Facing wall with arm horizontally abducted with cervical rotation to the opposite side 7x right Back to wall yellow band horizontal abduction 7x Open books against the wall 7x right/left     10/09/22: Manual therapy:  cervical distraction 2x10, 10 sec holds, suboccipital release; upper trap and levator stretching with contract/relax; rotation mobs with movement supine Counter push ups Row and rotate green band 10x right/left 1# front shoulder raises 1# lateral shoulder raises Supine cervical retractions 5x Supine shoulder press down 5x Supine UE elevation 5x with  coordinated breathing Supine yellow band: horizontal abduction, diagonals, external rotation 5-8x each Standing green band: rows, shoulder extensions, triceps extensions 10x each Seated cervical rotation mob with towel assist 10x right/left Seated thoracic extension with ball and towel roll 2x 10 (cues to avoid neck extension, use hands to support neck)     PATIENT EDUCATION:  Education details: Access Code: [email or web address] Person educated: Patient Education method: Explanation, Demonstration, Actor cues, Verbal cues, and Handouts Education comprehension: verbalized understanding and returned demonstration  HOME EXERCISE PROGRAM: Access Code: [email or web address] URL: [email or web address] Date: 10/19/2022 Prepared by: Lavinia Sharps  Exercises - Seated Passive Cervical Retraction  - 1 x daily - 7 x weekly - 3 sets - 10 reps - Supine Cervical Retraction with Towel  - 1 x daily - 7 x weekly - 3 sets - 10 reps - Supine Scapular Retraction  - 1 x daily - 7 x weekly - 3 sets - 10 reps - Seated Scapular Retraction  - 1 x daily - 7 x weekly - 3 sets - 10 reps - Seated Gentle Upper Trapezius Stretch  - 1 x daily - 7 x weekly - 1 sets - 3 reps - 30 sec hold - Sidelying Open Book Thoracic Lumbar Rotation and Extension  - 1 x daily - 7 x weekly - 1 sets - 10 reps - Seated Assisted Cervical Rotation with Towel  - 1 x daily - 7 x weekly - 1 sets - 10 reps - Seated Thoracic Lumbar Extension with Pectoralis Stretch  -  1 x daily - 7 x weekly - 1 sets - 10 reps - Supine Shoulder Horizontal Abduction with Resistance  - 1 x daily - 7 x weekly - 1 sets - 10 reps - Supine Shoulder External Rotation with Resistance  - 1 x daily - 7 x weekly - 1 sets - 10 reps - Standing Row with Anchored Resistance  - 1 x daily - 7 x weekly - 1 sets - 10 reps - Shoulder extension with resistance - Neutral  - 1 x daily - 7 x weekly - 1 sets - 10 reps - Standing Elbow Extension with Anchored Resistance  - 1 x daily - 7 x  weekly - 1 sets - 10 reps - Seated Elbow Extension with Self-Anchored Resistance  - 1 x daily - 7 x weekly - 1 sets - 10 reps - Push-Up on Counter  - 1 x daily - 7 x weekly - 1 sets - 10 reps - Standing Shoulder Abduction with Dumbbell  - 1 x daily - 7 x weekly - 1 sets - 10 reps  Patient Education - Trigger Point Dry Needling Access Code: [email or web address] URL: [email or web address] Date: 04/09/ASSESSMENT:  CLINICAL IMPRESSION:   The patient has been less symptomatic over past 2 weeks suggesting improved pain control as strength progresses. No peripheral symptoms as well for the past 2 weeks.  Able to progress exercises to standing rather than supine.  Improving cervical rotation ROM noted.  Therapist monitoring response to all interventions and modifying treatment accordingly.       OBJECTIVE IMPAIRMENTS: decreased ROM, decreased strength, increased muscle spasms, postural dysfunction, and pain.   ACTIVITY LIMITATIONS:  looking up to reach overhead  PARTICIPATION LIMITATIONS: community activity and driving (helping husband look)  PERSONAL FACTORS: 1 comorbidity: OA  are also affecting patient's functional outcome.   REHAB POTENTIAL: Excellent  CLINICAL DECISION MAKING: Stable/uncomplicated  EVALUATION COMPLEXITY: Low   GOALS: Goals reviewed with patient? Yes  SHORT TERM GOALS: Target date: 11/05/22  Ind with intial HEP Baseline:  Goal status: met 4/19 2.  Pain 25%less Baseline:  Goal status: met 4/19  3.  Pt will demonstrate 50 deg rotation bil without pain Baseline:  Goal status: ongoing    LONG TERM GOALS: Target date: 6/6//24  Pt will be independent with advanced HEP to maintain improvements made throughout therapy  Baseline:  Goal status: INITIAL  2.  Pt will report at least 75% less pain Baseline:  Goal status: INITIAL  3.  Pt will be able to do cervical rotation of at least 65 deg bilaterally for improved looking during functional  activities Baseline:  Goal status: INITIAL  4.  Pt will be able to look up during functional overhead activities without pain due to improved posture and mobility Baseline:  Goal status: INITIAL     PLAN:  PT FREQUENCY: 1x/week  PT DURATION: 12 weeks  PLANNED INTERVENTIONS: Therapeutic exercises, Therapeutic activity, Neuromuscular re-education, Balance training, Gait training, Patient/Family education, Self Care, Joint mobilization, Dry Needling, Electrical stimulation, Cryotherapy, Moist heat, Traction, Biofeedback, Manual therapy, and Re-evaluation  PLAN FOR NEXT SESSION: 1 more visits then going [place] of the [place]; check progress toward goals, ROM,cervical and thoracic ROM and postural strengthening; hold on DN per patient request  Lavinia Sharps, PT 10/24/22 3:25 PM Phone: [phone] Fax: [phone]

## 2022-10-30 ENCOUNTER — Ambulatory Visit: Payer: HMO | Admitting: Physical Therapy

## 2022-10-30 DIAGNOSIS — R293 Abnormal posture: Secondary | ICD-10-CM

## 2022-10-30 DIAGNOSIS — M62838 Other muscle spasm: Secondary | ICD-10-CM

## 2022-10-30 DIAGNOSIS — M542 Cervicalgia: Secondary | ICD-10-CM

## 2022-10-30 NOTE — Therapy (Signed)
OUTPATIENT PHYSICAL THERAPY CERVICAL PROGRESS NOTE/DISCHARGE SUMMARY   Patient Name: Stacy Murphy MRN: [id number] DOB:[birth year], 72 y.o., female Today's Date: 10/30/2022  END OF SESSION:  PT End of Session - 10/30/22 0847     Visit Number 6    Date for PT Re-Evaluation 12/06/22    Authorization Type [employer]    PT Start Time 0847    PT Stop Time 0929    PT Time Calculation (min) 42 min    Activity Tolerance Patient tolerated treatment well             Past Medical History:  Diagnosis Date   Erosive oral lichen planus    Osteoporosis    Past Surgical History:  Procedure Laterality Date   TUBAL LIGATION     Patient Active Problem List   Diagnosis Date Noted   TIA (transient ischemic attack) 06/12/2016    PCP: Ignatius Specking, MD  REFERRING PROVIDER: Darrow Bussing, MD  REFERRING DIAG: M54.2 (ICD-10-CM) - Neck pain  THERAPY DIAG:  Cervicalgia  Abnormal posture  Other muscle spasm  Rationale for Evaluation and Treatment: Rehabilitation  ONSET DATE: 1-2 months  SUBJECTIVE:                                                                                                                                                                                                         SUBJECTIVE STATEMENT:  My neck is feeling better. No pain at rest, just a little pain with moving PERTINENT HISTORY:  osteoarthritis  PAIN:  PAIN:  Are you having pain? Yes NPRS scale:1- 2/10  Pain location: neck Pain orientation: Bilateral  right > left PAIN TYPE: aching Pain description: intermittent  Aggravating factors: turning head and some looking up and down Relieving factors: aleve    PRECAUTIONS: None  WEIGHT BEARING RESTRICTIONS: No  FALLS:  Has patient fallen in last 6 months? No  LIVING ENVIRONMENT: Lives with: lives with their family and lives with their spouse Lives in: House/apartment   OCCUPATION: NA  PLOF: Independent  PATIENT GOALS:  get rid of pain  NEXT MD VISIT:   OBJECTIVE:   PATIENT SURVEYS:  FOTO 58 4/30:  72%   COGNITION: Overall cognitive status: Within functional limits for tasks assessed  SENSATION: Appears normal - no numbness  POSTURE: rounded shoulders, forward head, increased thoracic kyphosis, and mid-thoracic rotation to the right  PALPATION: Tight upper traps and paraspinals tight and mildly tender to palpation   CERVICAL ROM: pain only with rotation, little bit with lateral flexion (more of a stretch)  Active ROM A/PROM (deg) eval 4/19 4/30  Flexion 42 50 45  Extension 30 35 48  Right lateral flexion 25 35 45  Left lateral flexion 28 37 40  Right rotation 38 47 mild pain 38 no pain  Left rotation 52 45  38 no pain   (Blank rows = not tested)  UPPER EXTREMITY ROM: full shoulder ROM  UPPER EXTREMITY MMT: shoulder MMT 4/5 bilateral; cervical MMT 5/5 all directions and pain free 4/30:  shoulders grossly 4+/5 bil  CERVICAL SPECIAL TESTS:  Spurling's test: Negative   TODAY'S TREATMENT:                                                                                                                              DATE:   4/30 Manual therapy:  cervical distraction 2x10, 10 sec holds, suboccipital release; upper trap and levator stretching with contract/relax; rotation mobs with movement supine Lacrosse ball on wall at levator scap muscles right/left with bil UE elevation 1# bil flexion 10x 1# horizontal abduction 10x Review of HEP FOTO Cervical ROM UE MMT    PATIENT EDUCATION:  Education details: Access Code: [email or web address] Person educated: Patient Education method: Explanation, Demonstration, Actor cues, Verbal cues, and Handouts Education comprehension: verbalized understanding and returned demonstration  HOME EXERCISE PROGRAM: Access Code: [email or web address] URL: [email or web address] Date: 10/19/2022 Prepared by: Lavinia Sharps  Exercises - Seated Passive Cervical  Retraction  - 1 x daily - 7 x weekly - 3 sets - 10 reps - Supine Cervical Retraction with Towel  - 1 x daily - 7 x weekly - 3 sets - 10 reps - Supine Scapular Retraction  - 1 x daily - 7 x weekly - 3 sets - 10 reps - Seated Scapular Retraction  - 1 x daily - 7 x weekly - 3 sets - 10 reps - Seated Gentle Upper Trapezius Stretch  - 1 x daily - 7 x weekly - 1 sets - 3 reps - 30 sec hold - Sidelying Open Book Thoracic Lumbar Rotation and Extension  - 1 x daily - 7 x weekly - 1 sets - 10 reps - Seated Assisted Cervical Rotation with Towel  - 1 x daily - 7 x weekly - 1 sets - 10 reps - Seated Thoracic Lumbar Extension with Pectoralis Stretch  - 1 x daily - 7 x weekly - 1 sets - 10 reps - Supine Shoulder Horizontal Abduction with Resistance  - 1 x daily - 7 x weekly - 1 sets - 10 reps - Supine Shoulder External Rotation with Resistance  - 1 x daily - 7 x weekly - 1 sets - 10 reps - Standing Row with Anchored Resistance  - 1 x daily - 7 x weekly - 1 sets - 10 reps - Shoulder extension with resistance - Neutral  - 1 x daily - 7 x weekly - 1 sets - 10 reps - Standing Elbow Extension with Anchored Resistance  - 1 x daily -  7 x weekly - 1 sets - 10 reps - Seated Elbow Extension with Self-Anchored Resistance  - 1 x daily - 7 x weekly - 1 sets - 10 reps - Push-Up on Counter  - 1 x daily - 7 x weekly - 1 sets - 10 reps - Standing Shoulder Abduction with Dumbbell  - 1 x daily - 7 x weekly - 1 sets - 10 reps  Patient Education - Trigger Point Dry Needling Access Code: [email or web address] URL: [email or web address] Date: 04/09/ ASSESSMENT:  CLINICAL IMPRESSION:   The patient has met the majority of rehab goals, with noted improvements in pain reduction, outcome score, ROM, strength and functional mobility.  A comprehensive HEP has been established and anticipate further improvements over time with regular performance of the program.  Recommend discharge from PT at this time.       OBJECTIVE  IMPAIRMENTS: decreased ROM, decreased strength, increased muscle spasms, postural dysfunction, and pain.   ACTIVITY LIMITATIONS:  looking up to reach overhead  PARTICIPATION LIMITATIONS: community activity and driving (helping husband look)  PERSONAL FACTORS: 1 comorbidity: OA  are also affecting patient's functional outcome.   REHAB POTENTIAL: Excellent  CLINICAL DECISION MAKING: Stable/uncomplicated  EVALUATION COMPLEXITY: Low   GOALS: Goals reviewed with patient? Yes  SHORT TERM GOALS: Target date: 11/05/22  Ind with intial HEP Baseline:  Goal status: met 4/19 2.  Pain 25%less Baseline:  Goal status: met 4/19  3.  Pt will demonstrate 50 deg rotation bil without pain Baseline:  Goal status: partially met    LONG TERM GOALS: Target date: 6/6//24  Pt will be independent with advanced HEP to maintain improvements made throughout therapy  Baseline:  Goal status: met 4/30  2.  Pt will report at least 75% less pain Baseline:  Goal status: met 4/30  3.  Pt will be able to do cervical rotation of at least 65 deg bilaterally for improved looking during functional activities Baseline:  Goal status: not met  4.  Pt will be able to look up during functional overhead activities without pain due to improved posture and mobility Baseline:  Goal status: met 4/30     PLAN: PHYSICAL THERAPY DISCHARGE SUMMARY  Visits from Start of Care: 6  Current functional level related to goals / functional outcomes: See clinical impressions above   Remaining deficits: As above   Education / Equipment: HEP   Patient agrees to discharge. Patient goals were met. Patient is being discharged due to meeting the stated rehab goals.  Lavinia Sharps, PT 10/30/22 7:23 PM Phone: [phone] Fax: [phone]

## 2022-11-01 ENCOUNTER — Encounter: Payer: Medicare HMO | Admitting: Physical Therapy

## 2022-11-08 ENCOUNTER — Ambulatory Visit: Payer: Medicare HMO | Admitting: Physical Therapy

## 2022-12-19 ENCOUNTER — Inpatient Hospital Stay: Admission: RE | Admit: 2022-12-19 | Payer: HMO | Source: Ambulatory Visit

## 2022-12-28 ENCOUNTER — Ambulatory Visit: Admit: 2022-12-28 | Payer: HMO | Admitting: Podiatry

## 2022-12-28 SURGERY — AMPUTATION, TOE
Anesthesia: Choice | Site: Toe | Laterality: Right

## 2023-01-07 ENCOUNTER — Other Ambulatory Visit (HOSPITAL_BASED_OUTPATIENT_CLINIC_OR_DEPARTMENT_OTHER): Payer: Self-pay | Admitting: Family Medicine

## 2023-01-07 DIAGNOSIS — Z1231 Encounter for screening mammogram for malignant neoplasm of breast: Secondary | ICD-10-CM

## 2023-01-22 ENCOUNTER — Ambulatory Visit (HOSPITAL_BASED_OUTPATIENT_CLINIC_OR_DEPARTMENT_OTHER)
Admission: RE | Admit: 2023-01-22 | Discharge: 2023-01-22 | Disposition: A | Discharge status: Home / Self Care | Payer: HMO | Source: Ambulatory Visit | Attending: Family Medicine | Admitting: Family Medicine

## 2023-01-22 DIAGNOSIS — Z1231 Encounter for screening mammogram for malignant neoplasm of breast: Secondary | ICD-10-CM | POA: Diagnosis not present

## 2023-02-12 DIAGNOSIS — M2042 Other hammer toe(s) (acquired), left foot: Secondary | ICD-10-CM | POA: Diagnosis not present

## 2023-02-12 DIAGNOSIS — M2041 Other hammer toe(s) (acquired), right foot: Secondary | ICD-10-CM | POA: Diagnosis not present

## 2023-02-12 DIAGNOSIS — L84 Corns and callosities: Secondary | ICD-10-CM | POA: Diagnosis not present

## 2023-02-12 DIAGNOSIS — Q7023 Fused toes, bilateral: Secondary | ICD-10-CM | POA: Diagnosis not present

## 2023-03-06 DIAGNOSIS — E038 Other specified hypothyroidism: Secondary | ICD-10-CM | POA: Diagnosis not present

## 2023-03-06 DIAGNOSIS — Z79899 Other long term (current) drug therapy: Secondary | ICD-10-CM | POA: Diagnosis not present

## 2023-03-06 DIAGNOSIS — E559 Vitamin D deficiency, unspecified: Secondary | ICD-10-CM | POA: Diagnosis not present

## 2023-03-06 DIAGNOSIS — M810 Age-related osteoporosis without current pathological fracture: Secondary | ICD-10-CM | POA: Diagnosis not present

## 2023-05-01 DIAGNOSIS — I700 Atherosclerosis of aorta: Secondary | ICD-10-CM | POA: Diagnosis not present

## 2023-05-01 DIAGNOSIS — S2232XA Fracture of one rib, left side, initial encounter for closed fracture: Secondary | ICD-10-CM | POA: Diagnosis not present

## 2023-05-01 DIAGNOSIS — M546 Pain in thoracic spine: Secondary | ICD-10-CM | POA: Diagnosis not present

## 2023-05-10 DIAGNOSIS — S20222A Contusion of left back wall of thorax, initial encounter: Secondary | ICD-10-CM | POA: Diagnosis not present

## 2023-05-14 DIAGNOSIS — S20222A Contusion of left back wall of thorax, initial encounter: Secondary | ICD-10-CM | POA: Diagnosis not present

## 2023-05-21 DIAGNOSIS — S20222A Contusion of left back wall of thorax, initial encounter: Secondary | ICD-10-CM | POA: Diagnosis not present

## 2023-05-24 DIAGNOSIS — S20222A Contusion of left back wall of thorax, initial encounter: Secondary | ICD-10-CM | POA: Diagnosis not present

## 2023-05-27 DIAGNOSIS — S20222A Contusion of left back wall of thorax, initial encounter: Secondary | ICD-10-CM | POA: Diagnosis not present

## 2023-05-28 DIAGNOSIS — M8580 Other specified disorders of bone density and structure, unspecified site: Secondary | ICD-10-CM | POA: Diagnosis not present

## 2023-05-28 DIAGNOSIS — E663 Overweight: Secondary | ICD-10-CM | POA: Diagnosis not present

## 2023-05-28 DIAGNOSIS — E785 Hyperlipidemia, unspecified: Secondary | ICD-10-CM | POA: Diagnosis not present

## 2023-06-04 DIAGNOSIS — S20222A Contusion of left back wall of thorax, initial encounter: Secondary | ICD-10-CM | POA: Diagnosis not present

## 2023-06-07 DIAGNOSIS — S20222A Contusion of left back wall of thorax, initial encounter: Secondary | ICD-10-CM | POA: Diagnosis not present

## 2023-06-12 DIAGNOSIS — S20222A Contusion of left back wall of thorax, initial encounter: Secondary | ICD-10-CM | POA: Diagnosis not present

## 2023-06-14 DIAGNOSIS — S20222A Contusion of left back wall of thorax, initial encounter: Secondary | ICD-10-CM | POA: Diagnosis not present

## 2023-10-10 DIAGNOSIS — Z0000 Encounter for general adult medical examination without abnormal findings: Secondary | ICD-10-CM | POA: Diagnosis not present

## 2023-10-10 DIAGNOSIS — Z0001 Encounter for general adult medical examination with abnormal findings: Secondary | ICD-10-CM | POA: Diagnosis not present

## 2023-10-10 DIAGNOSIS — Z23 Encounter for immunization: Secondary | ICD-10-CM | POA: Diagnosis not present

## 2023-10-10 DIAGNOSIS — M810 Age-related osteoporosis without current pathological fracture: Secondary | ICD-10-CM | POA: Diagnosis not present

## 2023-10-10 DIAGNOSIS — E038 Other specified hypothyroidism: Secondary | ICD-10-CM | POA: Diagnosis not present

## 2023-10-10 DIAGNOSIS — E559 Vitamin D deficiency, unspecified: Secondary | ICD-10-CM | POA: Diagnosis not present

## 2023-10-10 DIAGNOSIS — Z79899 Other long term (current) drug therapy: Secondary | ICD-10-CM | POA: Diagnosis not present

## 2023-10-11 ENCOUNTER — Other Ambulatory Visit: Payer: Self-pay | Admitting: Family Medicine

## 2023-10-11 DIAGNOSIS — Z1231 Encounter for screening mammogram for malignant neoplasm of breast: Secondary | ICD-10-CM

## 2023-10-11 DIAGNOSIS — M810 Age-related osteoporosis without current pathological fracture: Secondary | ICD-10-CM

## 2023-11-04 DIAGNOSIS — H2513 Age-related nuclear cataract, bilateral: Secondary | ICD-10-CM | POA: Diagnosis not present

## 2023-11-04 DIAGNOSIS — Z0100 Encounter for examination of eyes and vision without abnormal findings: Secondary | ICD-10-CM | POA: Diagnosis not present

## 2024-01-07 ENCOUNTER — Ambulatory Visit (HOSPITAL_BASED_OUTPATIENT_CLINIC_OR_DEPARTMENT_OTHER): Admitting: Radiology

## 2024-01-07 ENCOUNTER — Ambulatory Visit (HOSPITAL_BASED_OUTPATIENT_CLINIC_OR_DEPARTMENT_OTHER)
Admission: RE | Admit: 2024-01-07 | Discharge: 2024-01-07 | Disposition: A | Discharge status: Home / Self Care | Source: Ambulatory Visit | Attending: Family Medicine | Admitting: Family Medicine

## 2024-01-07 DIAGNOSIS — M810 Age-related osteoporosis without current pathological fracture: Secondary | ICD-10-CM | POA: Diagnosis not present

## 2024-01-07 DIAGNOSIS — Z780 Asymptomatic menopausal state: Secondary | ICD-10-CM | POA: Diagnosis not present

## 2024-01-11 ENCOUNTER — Ambulatory Visit (HOSPITAL_BASED_OUTPATIENT_CLINIC_OR_DEPARTMENT_OTHER): Admitting: Radiology

## 2024-01-20 ENCOUNTER — Inpatient Hospital Stay (HOSPITAL_BASED_OUTPATIENT_CLINIC_OR_DEPARTMENT_OTHER): Admission: RE | Admit: 2024-01-20 | Source: Ambulatory Visit | Admitting: Radiology

## 2024-01-24 ENCOUNTER — Ambulatory Visit

## 2024-01-28 ENCOUNTER — Ambulatory Visit (HOSPITAL_BASED_OUTPATIENT_CLINIC_OR_DEPARTMENT_OTHER)
Admission: RE | Admit: 2024-01-28 | Discharge: 2024-01-28 | Disposition: A | Discharge status: Home / Self Care | Source: Ambulatory Visit | Attending: Family Medicine | Admitting: Family Medicine

## 2024-01-28 ENCOUNTER — Encounter (HOSPITAL_BASED_OUTPATIENT_CLINIC_OR_DEPARTMENT_OTHER): Payer: Self-pay | Admitting: Radiology

## 2024-01-28 DIAGNOSIS — Z1231 Encounter for screening mammogram for malignant neoplasm of breast: Secondary | ICD-10-CM | POA: Diagnosis not present

## 2024-04-10 DIAGNOSIS — Z23 Encounter for immunization: Secondary | ICD-10-CM | POA: Diagnosis not present

## 2024-04-10 DIAGNOSIS — Z79899 Other long term (current) drug therapy: Secondary | ICD-10-CM | POA: Diagnosis not present

## 2024-04-10 DIAGNOSIS — E559 Vitamin D deficiency, unspecified: Secondary | ICD-10-CM | POA: Diagnosis not present

## 2024-04-10 DIAGNOSIS — E7800 Pure hypercholesterolemia, unspecified: Secondary | ICD-10-CM | POA: Diagnosis not present

## 2024-04-10 DIAGNOSIS — M810 Age-related osteoporosis without current pathological fracture: Secondary | ICD-10-CM | POA: Diagnosis not present

## 2024-04-10 DIAGNOSIS — K219 Gastro-esophageal reflux disease without esophagitis: Secondary | ICD-10-CM | POA: Diagnosis not present

## 2024-08-28 ENCOUNTER — Other Ambulatory Visit

## 2024-09-18 ENCOUNTER — Other Ambulatory Visit
